# Patient Record
Sex: Male | Born: 1990 | Race: White | Hispanic: No | Marital: Single | Smoking: Current every day smoker
Health system: Southern US, Community
[De-identification: ages and names within clinical notes are randomized; demographics above are authoritative.]

## PROBLEM LIST (undated history)

## (undated) DIAGNOSIS — F329 Major depressive disorder, single episode, unspecified: Secondary | ICD-10-CM

## (undated) DIAGNOSIS — F1110 Opioid abuse, uncomplicated: Secondary | ICD-10-CM

## (undated) DIAGNOSIS — G4700 Insomnia, unspecified: Secondary | ICD-10-CM

## (undated) DIAGNOSIS — F32A Depression, unspecified: Secondary | ICD-10-CM

## (undated) DIAGNOSIS — F419 Anxiety disorder, unspecified: Secondary | ICD-10-CM

## (undated) DIAGNOSIS — F1410 Cocaine abuse, uncomplicated: Secondary | ICD-10-CM

## (undated) HISTORY — PX: OTHER SURGICAL HISTORY: SHX169

---
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
## 2012-06-15 ENCOUNTER — Encounter (HOSPITAL_COMMUNITY): Payer: Self-pay | Admitting: *Deleted

## 2012-06-15 ENCOUNTER — Emergency Department (HOSPITAL_COMMUNITY): Payer: No Typology Code available for payment source

## 2012-06-15 ENCOUNTER — Emergency Department (HOSPITAL_COMMUNITY)
Admission: EM | Admit: 2012-06-15 | Discharge: 2012-06-16 | Disposition: A | Discharge status: Home / Self Care | Payer: No Typology Code available for payment source | Attending: Emergency Medicine | Admitting: Emergency Medicine

## 2012-06-15 DIAGNOSIS — S0990XA Unspecified injury of head, initial encounter: Secondary | ICD-10-CM

## 2012-06-15 DIAGNOSIS — Y92410 Unspecified street and highway as the place of occurrence of the external cause: Secondary | ICD-10-CM | POA: Insufficient documentation

## 2012-06-15 DIAGNOSIS — Z79899 Other long term (current) drug therapy: Secondary | ICD-10-CM | POA: Insufficient documentation

## 2012-06-15 DIAGNOSIS — R1032 Left lower quadrant pain: Secondary | ICD-10-CM | POA: Insufficient documentation

## 2012-06-15 DIAGNOSIS — Y998 Other external cause status: Secondary | ICD-10-CM | POA: Insufficient documentation

## 2012-06-15 DIAGNOSIS — R51 Headache: Secondary | ICD-10-CM | POA: Insufficient documentation

## 2012-06-15 DIAGNOSIS — S139XXA Sprain of joints and ligaments of unspecified parts of neck, initial encounter: Secondary | ICD-10-CM | POA: Insufficient documentation

## 2012-06-15 DIAGNOSIS — F17200 Nicotine dependence, unspecified, uncomplicated: Secondary | ICD-10-CM | POA: Insufficient documentation

## 2012-06-15 DIAGNOSIS — S161XXA Strain of muscle, fascia and tendon at neck level, initial encounter: Secondary | ICD-10-CM

## 2012-06-15 MED ORDER — IOHEXOL 300 MG/ML  SOLN
80.0000 mL | Freq: Once | INTRAMUSCULAR | Status: AC | PRN
  Administered 2012-06-15: 80 mL via INTRAVENOUS

## 2012-06-15 MED ORDER — IBUPROFEN 800 MG PO TABS: 800.0000 mg | ORAL_TABLET | Freq: Three times a day (TID) | ORAL | Status: DC

## 2012-06-15 MED ORDER — IBUPROFEN 800 MG PO TABS
800.0000 mg | ORAL_TABLET | Freq: Once | ORAL | Status: AC
  Administered 2012-06-15: 800 mg via ORAL
  Filled 2012-06-15: qty 1
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
## 2012-06-15 NOTE — ED Notes (Signed)
Patient transported to CT 
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
## 2012-06-15 NOTE — ED Notes (Addendum)
NURSE FIRST ROUNDS :  NURSE EXPLAINED DELAY , WAIT TIME AND PROCESS , RESPIRATIONS UNLABORED , DENIES PAIN AT THIS TIME , C- COLLAR INTACT.

## 2012-06-15 NOTE — ED Notes (Signed)
Phili collar applied

## 2012-06-16 MED ORDER — KETOROLAC TROMETHAMINE 30 MG/ML IJ SOLN
30.0000 mg | Freq: Once | INTRAMUSCULAR | Status: AC
  Administered 2012-06-16: 30 mg via INTRAVENOUS
  Filled 2012-06-16: qty 1

## 2012-06-28 ENCOUNTER — Other Ambulatory Visit: Payer: Self-pay | Admitting: Internal Medicine

## 2012-06-28 DIAGNOSIS — R42 Dizziness and giddiness: Secondary | ICD-10-CM

## 2012-07-06 ENCOUNTER — Ambulatory Visit
Admission: RE | Admit: 2012-07-06 | Discharge: 2012-07-06 | Disposition: A | Discharge status: Home / Self Care | Payer: BC Managed Care – PPO | Source: Ambulatory Visit | Attending: Internal Medicine | Admitting: Internal Medicine

## 2012-07-06 DIAGNOSIS — R42 Dizziness and giddiness: Secondary | ICD-10-CM

## 2013-10-12 ENCOUNTER — Encounter (HOSPITAL_COMMUNITY): Payer: Self-pay | Admitting: Emergency Medicine

## 2013-10-12 ENCOUNTER — Emergency Department (HOSPITAL_COMMUNITY)
Admission: EM | Admit: 2013-10-12 | Discharge: 2013-10-13 | Disposition: A | Discharge status: Home / Self Care | Payer: BC Managed Care – PPO | Attending: Emergency Medicine | Admitting: Emergency Medicine

## 2013-10-12 DIAGNOSIS — F1410 Cocaine abuse, uncomplicated: Secondary | ICD-10-CM | POA: Diagnosis not present

## 2013-10-12 DIAGNOSIS — Z79899 Other long term (current) drug therapy: Secondary | ICD-10-CM | POA: Insufficient documentation

## 2013-10-12 DIAGNOSIS — F17200 Nicotine dependence, unspecified, uncomplicated: Secondary | ICD-10-CM | POA: Insufficient documentation

## 2013-10-12 DIAGNOSIS — F1110 Opioid abuse, uncomplicated: Secondary | ICD-10-CM | POA: Diagnosis present

## 2013-10-12 LAB — CBC WITH DIFFERENTIAL/PLATELET
BASOS ABS: 0.1 10*3/uL (ref 0.0–0.1)
Basophils Relative: 1 % (ref 0–1)
Eosinophils Absolute: 0.2 10*3/uL (ref 0.0–0.7)
Eosinophils Relative: 3 % (ref 0–5)
HCT: 36.7 % — ABNORMAL LOW (ref 39.0–52.0)
HEMOGLOBIN: 12.7 g/dL — AB (ref 13.0–17.0)
Lymphocytes Relative: 30 % (ref 12–46)
Lymphs Abs: 2.8 10*3/uL (ref 0.7–4.0)
MCH: 30 pg (ref 26.0–34.0)
MCHC: 34.6 g/dL (ref 30.0–36.0)
MCV: 86.8 fL (ref 78.0–100.0)
MONOS PCT: 16 % — AB (ref 3–12)
Monocytes Absolute: 1.5 10*3/uL — ABNORMAL HIGH (ref 0.1–1.0)
NEUTROS ABS: 4.6 10*3/uL (ref 1.7–7.7)
Neutrophils Relative %: 50 % (ref 43–77)
Platelets: 331 10*3/uL (ref 150–400)
RBC: 4.23 MIL/uL (ref 4.22–5.81)
RDW: 12.4 % (ref 11.5–15.5)
WBC: 9.1 10*3/uL (ref 4.0–10.5)

## 2013-10-12 MED ORDER — ACETAMINOPHEN 325 MG PO TABS: 650.0000 mg | ORAL_TABLET | ORAL | Status: DC | PRN

## 2013-10-12 MED ORDER — LORAZEPAM 1 MG PO TABS: 1.0000 mg | ORAL_TABLET | Freq: Three times a day (TID) | ORAL | Status: DC | PRN

## 2013-10-12 MED ORDER — ONDANSETRON HCL 4 MG PO TABS: 4.0000 mg | ORAL_TABLET | Freq: Three times a day (TID) | ORAL | Status: DC | PRN

## 2013-10-12 MED ORDER — NICOTINE 21 MG/24HR TD PT24: 21.0000 mg | MEDICATED_PATCH | Freq: Every day | TRANSDERMAL | Status: DC

## 2013-10-12 MED ORDER — ZOLPIDEM TARTRATE 5 MG PO TABS: 5.0000 mg | ORAL_TABLET | Freq: Every evening | ORAL | Status: DC | PRN

## 2013-10-12 MED ORDER — IBUPROFEN 200 MG PO TABS
600.0000 mg | ORAL_TABLET | Freq: Three times a day (TID) | ORAL | Status: DC | PRN
Start: 2013-10-12 — End: 2013-10-13

## 2013-10-12 MED ORDER — ALUM & MAG HYDROXIDE-SIMETH 200-200-20 MG/5ML PO SUSP: 30.0000 mL | ORAL | Status: DC | PRN

## 2013-10-12 NOTE — ED Notes (Signed)
Pts name called to go to a room no answer 

## 2013-10-12 NOTE — Progress Notes (Signed)
  CARE MANAGEMENT ED NOTE 10/12/2013  Patient:  Gregory Andrade,Gregory Andrade   Account Number:  [id number]  Date Initiated:  10/12/2013  Documentation initiated by:  Radford Pax  Subjective/Objective Assessment:   Patient presents to Ed with trying to detox from heroin, methadone, cocaine, and methamphetamines.  Withdrawal symptoms include generalized pain, nausea, insomnia, weakness, tremors     Subjective/Objective Assessment Detail:     Action/Plan:   Consult TTS   Action/Plan Detail:   Anticipated DC Date:       Status Recommendation to Physician:   Result of Recommendation:    Other ED Services  Consult Working Plan   In-house referral  Clinical Social Worker   DC Associate Professor  CM consult  Other    Choice offered to / List presented to:            Status of service:  Completed, signed off  ED Comments:   ED Comments Detail:  EDCM spoke to EDPA regarding disposition.  EDCM explained to EDPA the need for patient to be medically stable prior to discharge.  [facility] consulted EDSW who spoke to [facility] and faxed over information substance abuse  resources, prescreen tool for [facility], and contact information for [facility], [facility], [facility], and [facility] for detox.  Blood work ordered for patient.  No further EDCM needs at this time.

## 2013-10-12 NOTE — ED Provider Notes (Signed)
CSN: [id number]     Arrival date & time 10/12/13  1522 History   First MD Initiated Contact with Patient 10/12/13 2110     Chief Complaint  Patient presents with  . detox      (Consider location/radiation/quality/duration/timing/severity/associated sxs/prior Treatment) HPI  Patient presents with request for detox.  He was told by [facility] he needed to come here first before being placed.  States he injects drugs and injects everything he can find, mostly narcotics.  Uses cocaine, ketamine, methadone, methamphetamines, ecstasy, all psychedelics, crack.  Denies ETOH.  Uses very occasional benzodiazepines.  Has been weaning himself off of narcotics for two weeks.  Has felt clammy, hot sweats, insomnia, myalgias, N/V.  Today he was feeling so bad he used heroin to improve his symptoms.  Currently feeling well.    History reviewed. No pertinent past medical history. Past Surgical History  Procedure Laterality Date  . Wisdom teeth     No family history on file. History  Substance Use Topics  . Smoking status: Current Every Day Smoker -- 1.00 packs/day    Types: Cigarettes  . Smokeless tobacco: Not on file  . Alcohol Use: No    Review of Systems  All other systems reviewed and are negative.     Allergies  Review of patient's allergies indicates no known allergies.  Home Medications   Prior to Admission medications   Medication Sig Start Date End Date Taking? Authorizing Provider  baclofen (LIORESAL) 10 MG tablet Take 10 mg by mouth as needed for muscle spasms.    Yes Historical Provider, MD  cloNIDine (CATAPRES) 0.1 MG tablet Take 0.1 mg by mouth 4 (four) times daily.   Yes Historical Provider, MD  methadone (DOLOPHINE) 10 MG tablet Take 60-70 mg by mouth daily.   Yes Historical Provider, MD   BP 113/59  Pulse 69  Temp(Src) 97.8 F (36.6 C) (Oral)  Resp 17  Ht  (1.778 m)  Wt 142 lb (64.411 kg)  BMI 20.37 kg/m2  SpO2 99% Physical Exam  Nursing note and vitals  reviewed. Constitutional: He appears well-developed and well-nourished. No distress.  HENT:  Head: Normocephalic and atraumatic.  Neck: Neck supple.  Cardiovascular: Normal rate and regular rhythm.   Pulmonary/Chest: Effort normal and breath sounds normal. No respiratory distress. He has no wheezes. He has no rales.  Abdominal: Soft. He exhibits no distension and no mass. There is no tenderness. There is no rebound and no guarding.  Neurological: He is alert. He exhibits normal muscle tone.  Skin: He is not diaphoretic.  Track marks, bilateral arms.    No e/o infection    ED Course  Procedures (including critical care time) Labs Review Labs Reviewed  CBC WITH DIFFERENTIAL - Abnormal; Notable for the following:    Hemoglobin 12.7 (*)    HCT 36.7 (*)    Monocytes Relative 16 (*)    Monocytes Absolute 1.5 (*)    All other components within normal limits  COMPREHENSIVE METABOLIC PANEL - Abnormal; Notable for the following:    Glucose, Bld 106 (*)    Total Bilirubin 0.2 (*)    All other components within normal limits  URINE RAPID DRUG SCREEN (HOSP PERFORMED) - Abnormal; Notable for the following:    Opiates POSITIVE (*)    Cocaine POSITIVE (*)    All other components within normal limits  ETHANOL    Imaging Review No results found.   EKG Interpretation None      10:48 PM I  have spoken with case Production designer, theatre/television/film and Child psychotherapist.  They suggest contacting [facility] and [facility], who can do multidrug detox.    11:52 PM I spoke with TTS who will attempt placement.    12:38 AM TTS has called many facilities.  Pt has option to go to [facility] to [facility].    MDM   Final diagnoses:  Narcotic abuse  Cocaine abuse    Patient with chronic polysubstance abuse presenting requesting detox. He would like to do outpatient treatment after inpatient detox. He uses injected medications, mostly narcotics. TTS found patient placement at [facility] in [place].  No SI.  Pt d/c to go to  [facility].  Discussed result, findings, treatment, and follow up  with patient.  Pt given return precautions.  Pt verbalizes understanding and agrees with plan.        [place], PA-C 10/13/13 [phone]

## 2013-10-12 NOTE — BH Assessment (Addendum)
Spoke to [employer], PA-C prior to assessment. Pt is requesting opiate detox but does not endorse SI/HI or other mental health concerns. He does not meet criteria for TTS assessment and should be referred out for detox.   Trixie Dredge PA-C will run labs to facilitate placement and this writer will seek placement.   [facility] per Melissa has no beds.  [facility] per Andrey Campanile they have beds available but do not take BC/BS.   [facility] per Coreen, pt would have to present in person for possible acceptance. They may take his insurance. [phone] address [address] [place], [place]   [facility] does not take admissions at night per recorded message.   [facility] residential treatment [phone] no answer.  Informed Trixie Dredge PA-C of [facility] being only option tonight. Faxed list of SA referrals.  Clista Bernhardt, [facility] Triage Specialist 10/13/2013 12:13 AM

## 2013-10-12 NOTE — ED Notes (Signed)
Pt stating he went to get food.  Pt back requesting to be treated.

## 2013-10-12 NOTE — ED Notes (Addendum)
Pt states that he is here for detox from heroin, cocaine, methadone, and methamphetamines. Pt states "i have been detoxing for 2 weeks but the methadone has been pretty hard to do". Pt states last use was heroin this morning. Pt states that his arm hurt from injecting. Pt states that he is having difficulty sleeping, body aches, dizziness.

## 2013-10-12 NOTE — ED Notes (Signed)
Pt trying to detox from heroin, methadone, cocaine, and methamphetamines.  Withdrawal symptoms include generalized pain, nausea, insomnia, weakness, tremors.  NAD, respirations equal and unlabored, skin warm and dry.

## 2013-10-13 LAB — RAPID URINE DRUG SCREEN, HOSP PERFORMED
Amphetamines: NOT DETECTED
Barbiturates: NOT DETECTED
Benzodiazepines: NOT DETECTED
COCAINE: POSITIVE — AB
OPIATES: POSITIVE — AB
Tetrahydrocannabinol: NOT DETECTED

## 2013-10-13 LAB — COMPREHENSIVE METABOLIC PANEL
ALT: 12 U/L (ref 0–53)
AST: 14 U/L (ref 0–37)
Albumin: 3.8 g/dL (ref 3.5–5.2)
Alkaline Phosphatase: 89 U/L (ref 39–117)
Anion gap: 12 (ref 5–15)
BUN: 18 mg/dL (ref 6–23)
CALCIUM: 9.2 mg/dL (ref 8.4–10.5)
CO2: 29 meq/L (ref 19–32)
CREATININE: 0.94 mg/dL (ref 0.50–1.35)
Chloride: 97 mEq/L (ref 96–112)
GFR calc Af Amer: >90 mL/min (ref >90)
GFR calc non Af Amer: >90 mL/min (ref >90)
Glucose, Bld: 106 mg/dL — ABNORMAL HIGH (ref 70–99)
Potassium: 3.9 mEq/L (ref 3.7–5.3)
SODIUM: 138 meq/L (ref 137–147)
TOTAL PROTEIN: 8.1 g/dL (ref 6.0–8.3)
Total Bilirubin: 0.2 mg/dL — ABNORMAL LOW (ref 0.3–1.2)

## 2013-10-13 LAB — ETHANOL

## 2013-10-13 NOTE — ED Provider Notes (Signed)
Medical screening examination/treatment/procedure(s) were performed by non-physician practitioner and as supervising physician I was immediately available for consultation/collaboration.   EKG Interpretation None        Cecillia Menees H Luka Reisch, MD 10/13/13 1043 

## 2013-10-13 NOTE — Discharge Instructions (Signed)
Go directly to [facility].  [address] [place], [place]. [phone]   Opioid Use Disorder Opioid use disorder is a mental disorder. It is the continued nonmedical use of opioids in spite of risks to health and well-being. Misused opioids include the street drug heroin. They also include pain medicines such as morphine, hydrocodone, oxycodone, and fentanyl. Opioids are very addictive. People who misuse opioids get an exaggerated feeling of well-being. Opioid use disorder often disrupts activities at home, work, or school. It may cause mental or physical problems.  A family history of opioid use disorder puts you at higher risk of it. People with opioid use disorder often misuse other drugs or have mental illness such as depression, posttraumatic stress disorder, or antisocial personality disorder. They also are at risk of suicide and death from overdose. SIGNS AND SYMPTOMS  Signs and symptoms of opioid use disorder include:  Use of opioids in larger amounts or over a longer period than intended.  Unsuccessful attempts to cut down or control opioid use.  A lot of time spent obtaining, using, or recovering from the effects of opioids.  A strong desire or urge to use opioids (craving).  Continued use of opioids in spite of major problems at work, school, or home because of use.  Continued use of opioids in spite of relationship problems because of use.  Giving up or cutting down on important life activities because of opioid use.  Use of opioids over and over in situations when it is physically hazardous, such as driving a car.  Continued use of opioids in spite of a physical problem that is likely related to use. Physical problems can include:  Severe constipation.  Poor nutrition.  Infertility.  Tuberculosis.  Aspiration pneumonia.  Infections such as human immunodeficiency virus (HIV) and hepatitis (from injecting opioids).  Continued use of opioids in spite of  a mental problem that is likely related to use. Mental problems can include:  Depression.  Anxiety.  Hallucinations.  Sleep problems.  Loss of sexual function.  Need to use more and more opioids to get the same effect, or lessened effect over time with use of the same amount (tolerance).  Having withdrawal symptoms when opioid use is stopped, or using opioids to reduce or avoid withdrawal symptoms. Withdrawal symptoms include:  Depressed, anxious, or irritable mood.  Nausea, vomiting, diarrhea, or intestinal cramping.  Muscle aches or spasms.  Excessive tearing or runny nose.  Dilated pupils, sweating, or hairs standing on end.  Yawning.  Fever, raised blood pressure, or fast pulse.  Restlessness or trouble sleeping. This does not apply to people taking opioids for medical reasons only. DIAGNOSIS Opioid use disorder is diagnosed by your health care provider. You may be asked questions about your opioid use and and how it affects your life. A physical exam may be done. A drug screen may be ordered. You may be referred to a mental health professional. The diagnosis of opioid use disorder requires at least two symptoms within 12 months. The type of opioid use disorder you have depends on the number of signs and symptoms you have. The type may be:  Mild. Two or three signs and symptoms.   Moderate. Four or five signs and symptoms.   Severe. Six or more signs and symptoms. TREATMENT  Treatment is usually provided by mental health professionals with training in substance use disorders.The following options are available:  Detoxification.This is the first step in treatment for withdrawal. It is medically supervised  withdrawal with the use of medicines. These medicines lessen withdrawal symptoms. They also raise the chance of becoming opioid free.  Counseling, also known as talk therapy. Talk therapy addresses the reasons you use opioids. It also addresses ways to keep you  from using again (relapse). The goals of talk therapy are to avoid relapse by:  Identifying and avoiding triggers for use.  Finding healthy ways to cope with stress.  Learning how to handle cravings.  Support groups. Support groups provide emotional support, advice, and guidance.  A medicine that blocks opioid receptors in your brain. This medicine can reduce opioid cravings that lead to relapse. This medicine also blocks the desired opioid effect when relapse occurs.  Opioids that are taken by mouth in place of the misused opioid (opioid maintenance treatment). These medicines satisfy cravings but are safer than commonly misused opioids. This often is the best option for people who continue to relapse with other treatments. HOME CARE INSTRUCTIONS   Take medicines only as directed by your health care provider.  Check with your health care provider before starting new medicines.  Keep all follow-up visits as directed by your health care provider. SEEK MEDICAL CARE IF:  You are not able to take your medicines as directed.  Your symptoms get worse. SEEK IMMEDIATE MEDICAL CARE IF:  You have serious thoughts about hurting yourself or others.  You may have taken an overdose of opioids. FOR MORE INFORMATION  National Institute on Drug Abuse: http://www.price-smith.com/  Substance Abuse and Mental Health Services Administration: SkateOasis.com.pt Document Released: 11/03/2006 Document Revised: 05/23/2013 Document Reviewed: 01/19/2013 [facility] Patient Information 2015 [place], [place]. This information is not intended to replace advice given to you by your health care provider. Make sure you discuss any questions you have with your health care provider.

## 2013-10-18 ENCOUNTER — Encounter (HOSPITAL_COMMUNITY): Payer: Self-pay | Admitting: Psychology

## 2013-10-19 ENCOUNTER — Other Ambulatory Visit (HOSPITAL_COMMUNITY): Payer: PRIVATE HEALTH INSURANCE | Attending: Psychiatry | Admitting: Psychology

## 2013-10-19 DIAGNOSIS — F1920 Other psychoactive substance dependence, uncomplicated: Secondary | ICD-10-CM | POA: Insufficient documentation

## 2013-10-19 DIAGNOSIS — F4325 Adjustment disorder with mixed disturbance of emotions and conduct: Secondary | ICD-10-CM | POA: Diagnosis not present

## 2013-10-19 DIAGNOSIS — F1120 Opioid dependence, uncomplicated: Secondary | ICD-10-CM

## 2013-10-19 DIAGNOSIS — F410 Panic disorder [episodic paroxysmal anxiety] without agoraphobia: Secondary | ICD-10-CM | POA: Insufficient documentation

## 2013-10-21 ENCOUNTER — Other Ambulatory Visit (HOSPITAL_COMMUNITY): Payer: PRIVATE HEALTH INSURANCE | Attending: Psychiatry | Admitting: Psychology

## 2013-10-21 DIAGNOSIS — G4700 Insomnia, unspecified: Secondary | ICD-10-CM | POA: Insufficient documentation

## 2013-10-21 DIAGNOSIS — F1123 Opioid dependence with withdrawal: Secondary | ICD-10-CM | POA: Diagnosis not present

## 2013-10-21 DIAGNOSIS — F19982 Other psychoactive substance use, unspecified with psychoactive substance-induced sleep disorder: Secondary | ICD-10-CM | POA: Insufficient documentation

## 2013-10-21 DIAGNOSIS — F410 Panic disorder [episodic paroxysmal anxiety] without agoraphobia: Secondary | ICD-10-CM | POA: Diagnosis not present

## 2013-10-21 DIAGNOSIS — F1910 Other psychoactive substance abuse, uncomplicated: Secondary | ICD-10-CM | POA: Insufficient documentation

## 2013-10-21 DIAGNOSIS — Z9114 Patient's other noncompliance with medication regimen: Secondary | ICD-10-CM | POA: Insufficient documentation

## 2013-10-21 DIAGNOSIS — F1120 Opioid dependence, uncomplicated: Secondary | ICD-10-CM | POA: Insufficient documentation

## 2013-10-21 DIAGNOSIS — F411 Generalized anxiety disorder: Secondary | ICD-10-CM | POA: Diagnosis not present

## 2013-10-21 DIAGNOSIS — F4325 Adjustment disorder with mixed disturbance of emotions and conduct: Secondary | ICD-10-CM | POA: Insufficient documentation

## 2013-10-21 DIAGNOSIS — G4710 Hypersomnia, unspecified: Secondary | ICD-10-CM | POA: Diagnosis not present

## 2013-10-21 DIAGNOSIS — F1994 Other psychoactive substance use, unspecified with psychoactive substance-induced mood disorder: Secondary | ICD-10-CM | POA: Insufficient documentation

## 2013-10-21 DIAGNOSIS — F4323 Adjustment disorder with mixed anxiety and depressed mood: Secondary | ICD-10-CM | POA: Insufficient documentation

## 2013-10-21 MED ORDER — KETOROLAC TROMETHAMINE 10 MG PO TABS: 10.0000 mg | ORAL_TABLET | Freq: Four times a day (QID) | ORAL | Status: DC | PRN

## 2013-10-21 MED ORDER — MIRTAZAPINE 15 MG PO TBDP: 15.0000 mg | ORAL_TABLET | Freq: Every day | ORAL | Status: DC

## 2013-10-21 MED ORDER — METHOCARBAMOL 750 MG PO TABS: 750.0000 mg | ORAL_TABLET | Freq: Three times a day (TID) | ORAL | Status: DC | PRN

## 2013-10-21 MED ORDER — HYDROXYZINE PAMOATE 25 MG PO CAPS: 25.0000 mg | ORAL_CAPSULE | ORAL | Status: DC | PRN

## 2013-10-21 NOTE — Progress Notes (Signed)
Psychiatric Assessment Adult  Patient Identification:  Gregory Andrade Date of Evaluation:  10/21/2013 Chief Complaint: "aches and pains-" "I've tried to fix it (using drugs) and I cant do it and I know my times running out" History of Chief Complaint:  Pt began using alcohol xanax and pot at age 23. Believes he was addicted almost immediatley . At age 80 he started using cocaine and opiates he "hated " cocaine  But 3 yrs later he began using opiates addictively after he "got sick on hallucinogens and speed (meth/ectasy/sasafras/research chemicals)" ("my first love") after taking them everyday and going to jail for dealing in hallucinogens and facing 30-50 yrs in prison.He was transferred to treatment from [facility] and [facility]) with 140 days of clean time until he used at [facility].He managed to avoid prison.Over the past 4 years he has used opiate IV daily mainly Heroin $150-300 daily. In August 2015 he ran out of money and sought help from [facility] in [place]. (He had been tried on Suboxone by Dr Dub Mikes at his private practice in 2012 but had bad experience -anhedonia/wgt loss).On Sept 12 2015  he overdosed on IV Ketamine combined with meth coke mushrooms and tried to quit for 5 days but couldn't. He quit going to Methadone clinic after this as well. 2 weeks later He came to the realization that having to stick a needle in his arm to function was no way to live. He sought help from a number of sorces but couldn't get any so he used again as he was in withdrawal.His psychologist Glendell Docker contacted the practice of [facility] and he saw another provider who prescribed clonidine  He also contacted [facility] about pt situation and he was accepted on the basis of his withdrawing off all controlled substances.He started IOP on Weds Oct 1.He used the Sunday before.1 tablet of codeine to help w/d and tried a gray powder a friend called him  about to try which turned out to be morphine.He had labs at [facility] 2 mos ago because he shared needles.They were negative.  HPI -See CC and Hx of CC also Counselor intake for CD IOP Review of Systems  Constitutional: Positive for activity change, appetite change, fatigue and unexpected weight change. Negative for fever, chills and diaphoresis.       Debilitated from IV drug use and opiate withdrawal Currently in Opiate withdrawal  HENT: Negative for congestion, dental problem, drooling, ear discharge, ear pain, facial swelling, hearing loss, postnasal drip, rhinorrhea, sinus pressure, sneezing, sore throat, tinnitus, trouble swallowing and voice change.   Eyes: Negative for photophobia, pain, discharge, redness and itching.  Respiratory: Negative for apnea, cough, choking, chest tightness, shortness of breath, wheezing and stridor.   Cardiovascular: Negative for chest pain, palpitations and leg swelling.  Gastrointestinal: Positive for diarrhea and constipation. Negative for nausea, abdominal pain, blood in stool, abdominal distention, anal bleeding and rectal pain.  Endocrine: Negative for cold intolerance, heat intolerance, polydipsia, polyphagia and polyuria.  Genitourinary: Negative for dysuria, urgency, frequency, hematuria, flank pain, decreased urine volume, discharge, penile swelling, scrotal swelling, enuresis, difficulty urinating, penile pain and testicular pain.  Musculoskeletal: Positive for arthralgias, back pain, myalgias and neck pain. Negative for gait problem, joint swelling and neck stiffness.  Skin: Positive for wound (IV tracks). Negative for color change, pallor and rash.  Allergic/Immunologic: Negative for environmental allergies, food allergies and immunocompromised state.  Neurological: Negative for dizziness, tremors, seizures, syncope, facial  asymmetry, speech difficulty, weakness, light-headedness, numbness and headaches.  Hematological: Negative for adenopathy.  Does not bruise/bleed easily.  Psychiatric/Behavioral: Positive for behavioral problems, dysphoric mood, decreased concentration and agitation. Negative for suicidal ideas, hallucinations, confusion, sleep disturbance and self-injury. The patient is nervous/anxious. The patient is not hyperactive.    Physical Exam  Constitutional: He is oriented to person, place, and time.  Cachetic/pale  HENT:  Head: Normocephalic and atraumatic.  Right Ear: External ear normal.  Left Ear: External ear normal.  Nose: Nose normal.  Eyes: Conjunctivae and EOM are normal. Pupils are equal, round, and reactive to light. Right eye exhibits no discharge. Left eye exhibits no discharge.  Neck: Normal range of motion. Neck supple. No JVD present. No tracheal deviation present.  Cardiovascular: Normal rate.   Pulmonary/Chest: Effort normal. No stridor. No respiratory distress. He has no wheezes.  Abdominal:  deferred  Genitourinary:  deferred  Musculoskeletal: Normal range of motion. He exhibits no edema.  Neurological: He is alert and oriented to person, place, and time. No cranial nerve deficit. Coordination normal.  Skin: Skin is warm and dry. There is pallor.  Needle tracks  Psychiatric:  See PSE below    Depressive Symptoms: anhedonia, hypersomnia, fatigue, feelings of worthlessness/guilt, difficulty concentrating, hopelessness, anxiety, panic attacks, loss of energy/fatigue, disturbed sleep, weight loss, decreased labido, decreased appetite,  (Hypo) Manic Symptoms:   Elevated Mood:  NA Irritable Mood:  NA Grandiosity:  NA Distractibility:  NA Labiality of Mood:  NA Delusions:  NA Hallucinations:  NA Impulsivity:  NA Sexually Inappropriate Behavior:  NA Financial Extravagance:  NA Flight of Ideas:  NA  Anxiety Symptoms: Excessive Worry:  Yes Panic Symptoms:  Yes Agoraphobia:  No Obsessive Compulsive: Used to not as bad now  Symptoms: Checking, Specific Phobias:  No Social  Anxiety:  Yes   Psychotic Symptoms:  Hallucinations: Negative None Delusions:  Negative Paranoia:  Negative   Ideas of Reference:  Negative  PTSD Symptoms: Ever had a traumatic exposure:  Negative Had a traumatic exposure in the last month:  Negative Re-experiencing: Negative None Hypervigilance:  Negative Hyperarousal: Negative None Avoidance: Negative None  Traumatic Brain Injury: Negative na  Past Psychiatric History: Diagnosis: Polysubstance dependence including opioids/ multiple psychiatric mood diagnoses  Made while using including GAD/MDD/Bipolar  [facility] per HPI  Outpatient Care: Dr Gertie Gowda PHD/[facility] 1x  Substance Abuse Care: See above  Self-Mutilation: NA  Suicidal Attempts: 1 at age 84 attempted carbon monoxide poisoning-no treatment   Violent Behaviors: NA   Past Medical History:  No past medical history on file. History of Loss of Consciousness:  Yes Seizure History:  No Cardiac History:  Yes Allergies:  No Known Allergies Current Medications:  Current Outpatient Prescriptions  Medication Sig Dispense Refill  . amoxicillin (AMOXIL) 500 MG capsule       . baclofen (LIORESAL) 10 MG tablet Take 10 mg by mouth as needed for muscle spasms.       . cephALEXin (KEFLEX) 500 MG capsule       . chlorhexidine (PERIDEX) 0.12 % solution       . clobetasol (TEMOVATE) 0.05 % external solution       . cloNIDine (CATAPRES) 0.1 MG tablet Take 0.1 mg by mouth 4 (four) times daily.      Marland Kitchen dexamethasone (DECADRON) 4 MG tablet       . gabapentin (NEURONTIN) 100 MG capsule       . ketoconazole (NIZORAL) 2 % shampoo       .  methadone (DOLOPHINE) 10 MG tablet Take 60-70 mg by mouth daily.      Marland Kitchen. oxyCODONE-acetaminophen (PERCOCET) 10-325 MG per tablet       . traMADol (ULTRAM) 50 MG tablet        No current facility-administered medications for this visit.    Previous Psychotropic Medications:  Medication Dose    Suboxone  ?  Methadone  10 mg  Mirtazipine  7.5 mg HS  Multiple antidepressants ?            Substance Abuse History in the last 12 months: Substance Age of 1st Use Last Use Amount Specific Type  Nicotine  Not elicited ? ?   ?  Alcohol 14  08/17/13 1-2 drinks liquor  Cannabis 14  01/13/2013 Daily pot  Opiates 18 10/16/13 ? Morphine/1 tablet codeine Morphine/codeine  Cocaine 18 10/14/13 $200 Shoot/ snort  Methamphetamines 18 08/13/13 1/2 gram daily x 5 days IV Meth  LSD 18 2 yrs daily Oral LSD  Ecstasy [age] daily IV  Benzodiazepines 14 07/01/13 ? Xanax for W/D  Caffeine Not elicited ? ? ?  Inhalants 0 0 0 0  Others:Ketamine; designer drugs  10/01/13-OD ? 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 Legal History: See HPI Hobbies/Interests: Golf  Family History:  Dad's brother speed /crack  Mental Status Examination/Evaluation: Objective:  Appearance: Fairly Groomed and cachectic  Eye Contact::  Good  Speech:  Clear and Coherent  Volume:  Normal  Mood:  Euthymic  Affect:  Congruent and Full Range  Thought Process:  Comprehensible/Addicted  Orientation:  Full (Time, Place, and Person)  Thought Content:  WDL, Delusions, Ilusions and  Rumination  Suicidal Thoughts:  No  Homicidal Thoughts:  No  Judgement:  Impaired  [facility]:  Shallow  Psychomotor Activity:  Normal  Akathisia:  NA  Handed:  Right  AIMS (if indicated):  na  Assets:  Desire for Improvement Financial Resources/Insurance Housing Social Support Transportation    Laboratory/X-Ray Psychological Evaluation(s)   Pending visit to Urgent Care  CD IOP Counselor intake/Matthew Tamari PHD   Assessment:  See below  AXIS I Adjustment Disorder with Mixed Disturbance of Emotions and Conduct;Polysubstance dependence including opiods;SIMD ;SI sleep disorder  AXIS II Deferred  AXIS III No past medical history on file.   AXIS IV other psychosocial or environmental problems and problems with primary support group  AXIS V 41-50 serious symptoms   Treatment Plan/Recommendations:  Plan of Care: Attempt [facility] CD IOP-symptomatic withdrawal meds prescribed  Laboratory:  UDS-routine labs thru Urgent Care pt given list  Psychotherapy: CD IOP group and Individual;continue with Counselor outside  Medications: See Meds  Routine PRN Medications:  Yes opiate w/d robaxin/vistaril ketorolac  Consultations: None at this time  Safety Concerns:  None at this time  Other: Physicakl and labs at Urgent Care     [facility] 10/2/20152:47 PM
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
## 2013-10-23 ENCOUNTER — Encounter (HOSPITAL_COMMUNITY): Payer: Self-pay | Admitting: Psychology

## 2013-10-23 NOTE — Progress Notes (Signed)
    Daily Group Progress Note  Program: CD-IOP   Group Time: 1-2:30 pm  Participation Level: Active  Behavioral Response: Sharing  Type of Therapy: Process Group  Topic: After checking in, group members shared what had gone on in their lives since our last group session.  Group members shared about meetings they had attended.  One group member shared that he had relapsed, and processed that with the group.  There was one new group member who had the chance to introduce himself to the group today.  After checking in, the session shifted to focus on boundaries, how to set them, and discussion about areas where boundaries are needed in patients' lives were considered. Drug tests results were returned today, and drug tests were collected from patients who were absent last session  Group Time: 2:45-4 pm  Participation Level: Minimal  Behavioral Response: Appropriate  Type of Therapy: Psycho-education Group  Topic: The second part of the group was spent discussing the basics of 12-Step programs, in order to reinforce their importance in the recovery process.  Types of meetings were reviewed, and group members were able to talk about different meetings that they enjoyed.  The process of finding a sponsor was reviewed, and their role was discussed, as were the meanings of the 12-Steps.  Summary:  Today was the patient's first group session, and he was able to introduce himself to the group.  He expressed that he had always been to treatment before due to legal troubles, but this time, he is here for himself.  He expressed that he used to play the fiddle and golf, but does not anymore. The patient reported he had eaten 6 pop-tarts this morning, but hadn't eaten for at least 2 days prior. He is still feeling the effects of opioid withdrawal. He was relatively quiet during group discussion, but answered when asked questions.  The group was very welcoming.  The patient has been attending NA meetings  and his sobriety date is 9/26. A drug test was collected from this new group member.  Family Program: Family present? No   Name of family member(s):   UDS collected: Yes Results: positive for codeine and morphine  AA/NA attended?: YesMonday and Tuesday  Sponsor?: No   Darla Mcdonald, LCAS

## 2013-10-24 ENCOUNTER — Other Ambulatory Visit (HOSPITAL_COMMUNITY): Payer: PRIVATE HEALTH INSURANCE | Admitting: Psychology

## 2013-10-24 DIAGNOSIS — F4325 Adjustment disorder with mixed disturbance of emotions and conduct: Secondary | ICD-10-CM | POA: Diagnosis not present

## 2013-10-24 DIAGNOSIS — F1910 Other psychoactive substance abuse, uncomplicated: Secondary | ICD-10-CM

## 2013-10-24 DIAGNOSIS — F19982 Other psychoactive substance use, unspecified with psychoactive substance-induced sleep disorder: Secondary | ICD-10-CM

## 2013-10-24 DIAGNOSIS — F4323 Adjustment disorder with mixed anxiety and depressed mood: Secondary | ICD-10-CM

## 2013-10-24 DIAGNOSIS — F1120 Opioid dependence, uncomplicated: Secondary | ICD-10-CM

## 2013-10-25 ENCOUNTER — Encounter (HOSPITAL_COMMUNITY): Payer: Self-pay | Admitting: Psychology

## 2013-10-25 ENCOUNTER — Ambulatory Visit (HOSPITAL_COMMUNITY): Payer: Self-pay | Admitting: Licensed Clinical Social Worker

## 2013-10-25 DIAGNOSIS — F1120 Opioid dependence, uncomplicated: Secondary | ICD-10-CM

## 2013-10-25 NOTE — Progress Notes (Signed)
    Daily Group Progress Note  Program: CD-IOP   Group Time: 1-2:30 pm  Participation Level: Active  Behavioral Response: Sharing  Type of Therapy: Process Group  Topic: The session opened with a check-in from all group members. Their disclosures typically include how they worked their recovery program since our last group meeting. This includes meetings attended, sponsors and any issues that may have hindered or enhanced their recovery.  The group then discussed the Feeling Chart handout which shows the progression of the emotional aspects of the disorder/disease process. During the session today, the two new group members were seen by the clinical director.  Group Time: 2:45- 4pm  Participation Level: Active  Behavioral Response: Sharing  Type of Therapy: Psycho-education Group  Topic: The second part of the group involved a discussion of the Serenity Prayer, self-acceptance and self -knowledge. A handout was provided with the Serenity Prayer and a request that members identify 5 things they could not change and 5 things they could change. Each patient discussed their answers and explained how they had identified them as such.   Summary: The patient met with the medical director for the first time today.  The patient reported that he quit his job today because he wants to focus entirely on his recovery. 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 He went to a N/A meeting last night. He reports that he is still having stomach issues and hasn't eaten for 2 days. He still can't sleep, reports multitude of aches and pains, can't sleep and feels like he is worse mentally. He admits to taking a codeine pill on Sunday.   Family Program: Family present? No   Name of family member(s):   UDS collected: No Results:   AA/NA attended?: YesWednesday and Thursday  Sponsor?: No, but patient is new to the program   Coleta Grosshans, LCAS

## 2013-10-25 NOTE — Progress Notes (Signed)
    Daily Group Progress Note  Program: CD-IOP   Group Time: 1-2:30 pm  Participation Level: Active  Behavioral Response: Appropriate and Sharing  Type of Therapy: Process Group  Topic: After checking in, group members shared about what went on over the weekend, meetings they went to, and any significant issues they were having.  There was one new group member today who had the chance to introduce herself and share what has led her to this program.  The group then shifted into psychoeducation, where group members completed the "Wheel of Life" exercise.  This exercise assesses their level of satisfaction in eight life domains and shows the level of balance or imbalance represented in one's life.  Group Time: 2:45- 4pm  Participation Level: Active  Behavioral Response: Sharing  Type of Therapy: Psycho-education Group  Topic: The second part of group was spent processing the "Wheel of Life" exercise.  Group members then drew their own responses on the board, discussed their ratings, and processed what they can do to improve satisfaction in certain areas of their lives.  Group members provided feedback to each other and shared ideas about how to improve satisfaction in the different life domains.  Summary: The patient stated that he had laid on the couch all weekend, got his prescribed medications from the pharmacy, and went shopping with his mother to get a suit for a wedding he will be attending.  The patient told the group that he plans to drive himself to the wedding so that he will not be tempted to drink.  He said that he is feeling much better, and others in the group commented that he looks much better than he did as well.  He reported that he had some Chinese herbal tea last night, and he thinks that made him feel better.  During the "Wheel of Life" activity, the patient shared that he is not fulfilled or satisfied in any area of his life currently.  Toward the end of group, he stated  that he had found three clean syringes in his car and that was a huge trigger for him.  He told the group that he had thrown the syringes away, but did not know if he had anything else in his car.   The patient was much more talkative today and his sobriety date remains 9/26.   Family Program: Family present? No   Name of family member(s):   UDS collected: No Results:   AA/NA attended?: YesSaturday and Sunday  Sponsor?: No   Mira Balon, LCAS

## 2013-10-26 ENCOUNTER — Other Ambulatory Visit (HOSPITAL_COMMUNITY): Payer: PRIVATE HEALTH INSURANCE | Admitting: Psychology

## 2013-10-26 DIAGNOSIS — F4325 Adjustment disorder with mixed disturbance of emotions and conduct: Secondary | ICD-10-CM | POA: Diagnosis not present

## 2013-10-26 DIAGNOSIS — F1910 Other psychoactive substance abuse, uncomplicated: Secondary | ICD-10-CM

## 2013-10-26 DIAGNOSIS — F19982 Other psychoactive substance use, unspecified with psychoactive substance-induced sleep disorder: Secondary | ICD-10-CM

## 2013-10-26 DIAGNOSIS — F1120 Opioid dependence, uncomplicated: Secondary | ICD-10-CM

## 2013-10-27 ENCOUNTER — Encounter (HOSPITAL_COMMUNITY): Payer: Self-pay | Admitting: Psychology

## 2013-10-27 NOTE — Progress Notes (Signed)
    Daily Group Progress Note  Program: CD-IOP   Group Time: 1-1:45 pm  Participation Level: Active  Behavioral Response: Appropriate and Sharing  Type of Therapy: Process Group  Topic: The first part of group was spent in process. Members checked-in with sobriety dates and followed up with issues and concerns in early recovery. One member admitted he had relapsed and the relapse was discussed at length. Two new members were present and they shared briefly about themselves. The new members shared openly about the reasons for being in the group and they received good feedback and support from their new fellow group members. Both admitted that they were very hesitant and anxious about this first group experience, but reported that they felt much more relaxed after hearing others share about their experiences.   Group Time: 2-4 pm  Participation Level: Active  Behavioral Response: Sharing  Type of Therapy: Psycho-education Group  Topic: Guest Speaker: the remainder of the session was spent with a Herbalistguest visitor. This man talked about his life and his chemical use that began at a young age. He talked bout having been confronted by family and friends and the growing negative consequences of his drug use. The visitor invited questions and group members asked about certain feelings or experiences and he was very receptive and offered good feedback. The session was inspiring and offered possibilities sand hope that some may not have had previously. Drug tests were collected this afternoon.    Summary: The patient checked in and admitted he had relapsed. He described a series of events that culminated in him finding more syringes in his car. Upon discovering them, the call to his dealer, securing the dope and shooting it took less than 30 minutes. He relapsed before he 'actually' relapsed and it did not occur to him to do anything else. The group discussed different options he might have taken  instead of using. There was good support offered to this young man and he was encouraged to just start again and let it go. The patient made some excellent comments during the guest speaker's visit and described to one of the new members how one can never use again once they have become addicted. Despite his remorse and discouragement, the patient was validated and encouraged throughout the session. He responded well to this intervention. His sobriety date has now changed and is 10/6.   Family Program: Family present? No   Name of family member(s):   UDS collected: Yes Results: not returned, but patient admitted he used Monday evening  AA/NA attended?: YesTuesday  Sponsor?: No   Zade Falkner, LCAS

## 2013-10-28 ENCOUNTER — Other Ambulatory Visit (HOSPITAL_COMMUNITY): Payer: PRIVATE HEALTH INSURANCE | Admitting: Psychology

## 2013-10-28 DIAGNOSIS — F4325 Adjustment disorder with mixed disturbance of emotions and conduct: Secondary | ICD-10-CM | POA: Diagnosis not present

## 2013-10-28 DIAGNOSIS — F1123 Opioid dependence with withdrawal: Secondary | ICD-10-CM

## 2013-10-28 DIAGNOSIS — F1994 Other psychoactive substance use, unspecified with psychoactive substance-induced mood disorder: Secondary | ICD-10-CM

## 2013-10-28 DIAGNOSIS — F1120 Opioid dependence, uncomplicated: Secondary | ICD-10-CM

## 2013-10-28 DIAGNOSIS — F1193 Opioid use, unspecified with withdrawal: Secondary | ICD-10-CM | POA: Insufficient documentation

## 2013-10-28 DIAGNOSIS — F1910 Other psychoactive substance abuse, uncomplicated: Secondary | ICD-10-CM

## 2013-10-28 MED ORDER — CLONIDINE HCL 0.2 MG/24HR TD PTWK
0.2000 mg | MEDICATED_PATCH | TRANSDERMAL | Status: DC
Start: 2013-10-28 — End: 2013-11-03

## 2013-10-28 MED ORDER — CLONIDINE HCL 0.1 MG PO TABS: 0.1000 mg | ORAL_TABLET | Freq: Four times a day (QID) | ORAL | Status: DC

## 2013-10-28 NOTE — Progress Notes (Signed)
Patient ID: Gregory Andrade, male   DOB: 22-Jul-1990, 23 y.o.   MRN: [id number] Pt admits to using 1/2 gram Heroin Monday.He is noncompliant with other meds because "he is afraid of running out" CC thigh pains and insomnia. Explained he can get Meds from CD IOP so stop not taking them as prescribed.He is started on Clondine patc with po for breakthru.He has a BP cuff and is told NOT TO TAKE CLONIDINE IF BP 90/60 or less. Expained that he MUST STOP USING/[facility] is to have any chance at recovery. Advised he may need referral to higher level of care. Also reports after being given list of labs to be drawn he had to have a dx code.[facility] asking for lab work only instaed of for physical exam with appropriate labs (list given) for his H&P as originally instructed. Given DX code today 305.90 IV drug abuse F11.23 is only code close to situation in ICD 10 which for some reason chooses to ignore intravenous drug abuse a significant prognostic and severity factor!?

## 2013-10-29 ENCOUNTER — Encounter (HOSPITAL_COMMUNITY): Payer: Self-pay

## 2013-10-31 ENCOUNTER — Encounter (HOSPITAL_COMMUNITY): Payer: Self-pay | Admitting: Psychology

## 2013-10-31 ENCOUNTER — Other Ambulatory Visit (HOSPITAL_COMMUNITY): Payer: PRIVATE HEALTH INSURANCE | Admitting: Psychology

## 2013-10-31 DIAGNOSIS — F1120 Opioid dependence, uncomplicated: Secondary | ICD-10-CM

## 2013-10-31 DIAGNOSIS — F19982 Other psychoactive substance use, unspecified with psychoactive substance-induced sleep disorder: Secondary | ICD-10-CM

## 2013-10-31 DIAGNOSIS — F4325 Adjustment disorder with mixed disturbance of emotions and conduct: Secondary | ICD-10-CM | POA: Diagnosis not present

## 2013-10-31 NOTE — Progress Notes (Signed)
    Daily Group Progress Note  Program: CD-IOP   Group Time: 1-2 pm  Participation Level: Active  Behavioral Response: Sharing  Type of Therapy: Process Group  Topic: Process: the first part of group was spent in process. Members shared about the past 2 days and events, experiences or challenges in early recovery. One member had returned today after being gone for the last 4 sessions in order to complete a Xanax taper. A new group member was also present today. She had been in the group about 5 weeks ago, but had rel[facility] and [facility]. She introduced herself and explained what she needed to work on while in the program. There was good feedback and disclosures among members. Drug tests were collected from the new group members and those current members who had not been present in Wednesday's session. During the group today, the medical director pulled out new members along with current members to discuss medications or complete their assessment.   Group Time: 2:15-4 pm  Participation Level: Active  Behavioral Response: Sharing  Type of Therapy: Psycho-education Group  Topic: Guest Speaker: Pharmacist. The pharmacist, EP, [facility] the group today. She discussed the effects of drugs and early withdrawal and what those chemicals cause in the brain. She also shared about mediations frequently prescribed to address various types of mental illness. The guest speaker also identified what medications should be encouraged and what should be avoided when the patient is chemically dependent. There were many questions and the pharmacist answered all of them. The session went well with the members discussing how much they had learned from her visit.   Summary: The patient reported he felt like "shit". He described himself as in great pain and miserable. The patient reported he had been able to eat, but for the most part, his stomach is still very upset. I pointed out  he has 4 days of sobriety. The patient was able to meet with the medical director during this session. During the visit with the guest speaker, this patient asked numerous questions and received explanations for his physical discomfort that he found comforting and validating. The patient made some good comments and responded well to this intervention. His sobriety date is 9/6.    Family Program: Family present? No   Name of family member(s):   UDS collected: No Results:  AA/NA attended?: YesThursday  Sponsor?: No   Billye Nydam, LCAS

## 2013-11-01 ENCOUNTER — Encounter (HOSPITAL_COMMUNITY): Payer: Self-pay | Admitting: Psychology

## 2013-11-01 NOTE — Progress Notes (Signed)
    Daily Group Progress Note  Program: CD-IOP   Group Time: 1-2:30 pm  Participation Level: Active  Behavioral Response: Appropriate and Sharing  Type of Therapy: Process Group  Topic: After checking in, group members shared what the weekend was like for them, as well as any problems that were encountered.  Two group members shared that they had relapsed and the group processed what had happened, challenging each other when rationalization was evident.  There was one new group member who was able to introduce herself and share what has led her to the program.  Group Time: 2:45- 4pm  Participation Level: Active  Behavioral Response: Sharing  Type of Therapy: Psycho-education Group  Topic: The second half of group was spent discussing the issues of Core Beliefs and Self-Esteem.  The group completed a free write activity in which they wrote about themselves, and then shared what they had written with a partner.  Discussion exploring what the experience was like for group members followed.  The group watched a PowerPoint, while discussing their self esteem, core beliefs, and early life experiences that contributed to the development of those core beliefs.  Summary: The patient stated that he is very angry at his girlfriend because she lost his credit card.  He reported that he laid on the couch over the weekend and attended a wedding.  He left the wedding early when people started drinking.  He made some good comments during group and questioned another group member regarding her intention to visit her son.  He was unable to identify many positive qualities about himself as well.  He attended a meeting on Friday night and his sobriety date is 10/6.   Family Program: Family present? No   Name of family member(s):   UDS collected: Yes Results:   AA/NA attended?: YesSaturday and Sunday  Sponsor?: No   Gregory Andrade, LCAS

## 2013-11-02 ENCOUNTER — Other Ambulatory Visit (HOSPITAL_COMMUNITY): Payer: PRIVATE HEALTH INSURANCE | Admitting: Psychology

## 2013-11-02 DIAGNOSIS — F1120 Opioid dependence, uncomplicated: Secondary | ICD-10-CM

## 2013-11-02 DIAGNOSIS — F1910 Other psychoactive substance abuse, uncomplicated: Secondary | ICD-10-CM

## 2013-11-02 DIAGNOSIS — F19982 Other psychoactive substance use, unspecified with psychoactive substance-induced sleep disorder: Secondary | ICD-10-CM

## 2013-11-02 DIAGNOSIS — F4325 Adjustment disorder with mixed disturbance of emotions and conduct: Secondary | ICD-10-CM | POA: Diagnosis not present

## 2013-11-03 ENCOUNTER — Encounter (HOSPITAL_COMMUNITY): Payer: Self-pay | Admitting: Psychology

## 2013-11-03 ENCOUNTER — Telehealth (HOSPITAL_COMMUNITY): Payer: Self-pay | Admitting: Licensed Clinical Social Worker

## 2013-11-03 NOTE — Progress Notes (Signed)
    Daily Group Progress Note  Program: CD-IOP   Group Time: 1-2:30 pm  Participation Level: Active  Behavioral Response: Sharing  Type of Therapy: Process Group  Topic: Guest: the first part of group was spent with a guest speaker. The guest was a Orthoptistchaplain in the [place] system. He shared a little about his past and then discussed the topic of core beliefs with the group. He had been informed previously that the current topic had been negative core beliefs. There was good response among group members and they enjoyed the visit by this man who did no fit their vision of a chaplain. The session was engaging and invited even more questions.   Group Time: 2:45- 4pm  Participation Level: Active  Behavioral Response: Sharing, rationalizing, stereotyping  Type of Therapy: Psycho-education Group  Topic: Psycho-Ed: Core Beliefs: How they are formed and perpetuated. The second half of group was spent in a psycho-ed. The session included a slide show and a continuation of the session on Monday when the topic of "Core Beliefs" was first introduced. When asked for someone's core belief, a member offered hers. The belief was diagrammed on the board and the process and perpetuation of these distorted beliefs was described. There was good disclosure and feedback among group members.   Summary: The patient provided good feedback to his fellow group members during the check-in. He encouraged the new group member to not worry about what other people think about him, but do what is right for him. He admitted he has continued to struggle with withdrawal symptoms. He apologized to the group and admitted he has continued to use while here in the program. The patient talks, but some comments are illogical or stereotypical. I stopped him at one point and instructed him not to use stereotypes in this program. The patient reported he and his family are preparing to get [place] detox [place].  It will include sedating him and putting him through a fast detox while he is out. He is hopeful that he can get into this program. He was more open and disclosing than in previous sessions. His sobriety date is reportedly 10/10.    Family Program: Family present? No   Name of family member(s):   UDS collected: No Results:  AA/NA attended?: [place]  Sponsor?: No   Abeni Finchum, LCAS

## 2013-11-03 NOTE — Progress Notes (Unsigned)
Gregory Andrade is a 23 y.o. male patient ***. CD-IOP: Treatment Planning Session. I met with the patient for our first individual session to discuss goals for treatment. The patient was referred to CD-IOP by his therapist Hulen Luster for his continued use of IV drugs. The patient has been to 3 other treatment facilities while awaiting pending felony drug charges. The charges have all been dismissed and the patient is seeking treatment on his own volition. He was agreeable in identifying.2 goals for treatment with the first goal to establish and maintain abstinence. In the past, the patient has struggled with maintaining abstinent from all drugs with the ability to stay clean for only 4 days. The patient has a desire to maintain abstinence but he is in physical withdrawal from opiates as well as methadone and "just feels horrible." When he uses opiates, he uses not to get high but to just feel normal. The second treatment goal is to develop a sufficient support system for on-going abstinence. The patient attends approximately 5 meetings per week and has met some young people that he can identify with in the 12-step support meetings. He is still trying to identify a temporary sponsor. The program director Harold Hedge prescribed a withdrawal protocol for the patient to address the symptoms of withdrawal from opiates. The patient will meet weekly with the program director to address withdrawal symptoms. The treatment plan was reviewed, signed and dated and he responded well to the intervention. The patient has a desire to learn to live a sober life. He will be seen for individual session 1x per week during the duration of CD IOP program.       Lavinia Mcneely, LCAS

## 2013-11-04 ENCOUNTER — Other Ambulatory Visit (HOSPITAL_COMMUNITY): Payer: PRIVATE HEALTH INSURANCE

## 2013-11-07 ENCOUNTER — Other Ambulatory Visit (HOSPITAL_COMMUNITY): Payer: PRIVATE HEALTH INSURANCE

## 2013-11-09 ENCOUNTER — Other Ambulatory Visit (HOSPITAL_COMMUNITY): Payer: PRIVATE HEALTH INSURANCE | Admitting: Psychology

## 2013-11-09 DIAGNOSIS — F1120 Opioid dependence, uncomplicated: Secondary | ICD-10-CM

## 2013-11-09 DIAGNOSIS — F4325 Adjustment disorder with mixed disturbance of emotions and conduct: Secondary | ICD-10-CM | POA: Diagnosis not present

## 2013-11-09 DIAGNOSIS — F19982 Other psychoactive substance use, unspecified with psychoactive substance-induced sleep disorder: Secondary | ICD-10-CM

## 2013-11-09 DIAGNOSIS — F1910 Other psychoactive substance abuse, uncomplicated: Secondary | ICD-10-CM

## 2013-11-10 ENCOUNTER — Encounter (HOSPITAL_COMMUNITY): Payer: Self-pay | Admitting: Psychology

## 2013-11-10 NOTE — Progress Notes (Signed)
    Daily Group Progress Note  Program: CD-IOP   Group Time: 1-2:30 pm  Participation Level: Active  Behavioral Response: Sharing  Type of Therapy: Process Group  Topic: After checking in, group members each shared what has been going on in their lives since our last group meeting, and any recovery based activities they did.  The group members were all willing to share.  Drug tests were returned to group members today as well. One member arrived late for group and reported she had been contacted by DSS and they were investigating a complaint filed by someone in this group. This disclosure led to a lengthy discussion among group members.   Group Time: 2:45- 4pm  Participation Level: Active  Behavioral Response: Sharing  Type of Therapy: Psycho-education Group  Topic: Psychoeducation:  During the second part of group, the counselor led a discussion about how addiction affects the brain.  A slide show accompanied this presentation. The limbic system is the part of the brain where addiction occurs. Pet scans of an addict's brain were shown, including how it appears 10 days and 100 days after last use.  These slides helped members understand the lack of dopamine and accompanying discomfort and lethargy in early recovery. The concept of the brain's "plasticity" (or ability to repair itself) was emphasized.  Group members discussed their ideas and questions related to the material presented and were active and engaged in the session.  One group member read his "Step One" packet to the group and was given good feedback following his reading.  Summary:Today was the patient's first group since returning from [place] detox treatment in [place].   He stated that being home has been "tough" and that he feels like he can't function due to lack of sleep, cold sweats, and shakiness.  He stated that he is at a 3 on the pain scale, after being at an 8 last week.  The patient explained that he has had many  thoughts of using, but that he was able to reach out to another group member to plan to go to a meeting together.  He also mentioned that he might have gotten kicked out of his parents' house last night because they found out about some gold he had taken and then pawned to buy drugs.  The patient's parents are leaving to go to [place] on Friday, so he talked about things he could do to stay busy.  During the slide show, the patient wondered about his brain and whether it could every recover from his years of drug use? I assured him at the age of 23, there is tremendous plasticity and urged him to change his behaviors so his brain can heal. He also told the group that he was thankful to be back and that he overdosed on Thursday before leaving for [place].  There was a glimmer of gratitude expressed by this young man. The patient's sobriety date is 10/10.    Family Program: Family present? No   Name of family member(s):   UDS collected: No Results:   AA/NA attended?: No , but he just returned [place]  Sponsor?: No   Renzo Vincelette, LCAS

## 2013-11-11 ENCOUNTER — Other Ambulatory Visit (HOSPITAL_COMMUNITY): Payer: PRIVATE HEALTH INSURANCE | Admitting: Psychology

## 2013-11-11 DIAGNOSIS — F4325 Adjustment disorder with mixed disturbance of emotions and conduct: Secondary | ICD-10-CM | POA: Diagnosis not present

## 2013-11-11 DIAGNOSIS — F1120 Opioid dependence, uncomplicated: Secondary | ICD-10-CM

## 2013-11-11 DIAGNOSIS — F1193 Opioid use, unspecified with withdrawal: Secondary | ICD-10-CM

## 2013-11-11 DIAGNOSIS — F1123 Opioid dependence with withdrawal: Secondary | ICD-10-CM

## 2013-11-11 DIAGNOSIS — F19982 Other psychoactive substance use, unspecified with psychoactive substance-induced sleep disorder: Secondary | ICD-10-CM

## 2013-11-11 MED ORDER — QUETIAPINE FUMARATE 50 MG PO TABS: ORAL_TABLET | ORAL | Status: DC

## 2013-11-11 NOTE — Progress Notes (Signed)
Patient ID: Leta BaptistMitchell L Andrade, male   DOB: 1990/11/23, 23 y.o.   MRN: [id number] Pt seen post Rapid Detox- C/O insomnia mainly.Admits to have taken all Valium but not the others.Wants to try seroquel-called Detox and got extension of Valium -[place].Will send seroquel .Reinforced his perception that physically he is out of pain but the mental addiction remains -this is the next step in recovery-the restoration to sanity-Vivitrol will keep him from reward biologically but recovery [facility]/ways of not using from here on out

## 2013-11-13 ENCOUNTER — Encounter (HOSPITAL_COMMUNITY): Payer: Self-pay

## 2013-11-14 ENCOUNTER — Other Ambulatory Visit (HOSPITAL_COMMUNITY): Payer: PRIVATE HEALTH INSURANCE

## 2013-11-14 ENCOUNTER — Encounter (HOSPITAL_COMMUNITY): Payer: Self-pay | Admitting: Psychology

## 2013-11-14 NOTE — Progress Notes (Signed)
    Daily Group Progress Note  Program: CD-IOP   Group Time: 1-2:30 pm  Participation Level: Active  Behavioral Response: Sharing  Type of Therapy: Process Group  Topic: The first part of group was spent sharing what has happened for patients since we last met. Group members shared about problems they were having and also told the group about any recovery related activities they had been doing.  One group member had relapsed and was able to process with the group.  The program director met with some group members and drug test results were returned.  Group Time: 2:45- 4pm  Participation Level: Active  Behavioral Response: Sharing  Type of Therapy: Psycho-education Group  Topic: During the second part of group, patients who had not gotten a chance to share yet were able to.  Toward the end of the session, a Self-Care Assessment was passed out and patients began working on them.  They are being asked to finish the assessment over the weekend and bring it back to group on Monday.  Summary: The patient stated that he had not slept since Tuesday and was "panicking" because his parents left for [place] today.  He told the group that he had a good talk with his mom before she left and that she was being supportive.  However, he is very worried because every time his parents go out of town, "something bad happens".  He is anxious about the downtime he will have, and gets upset when he thinks that he can't get high.  He stated that he has been craving alcohol and that he had changed his number so his dealers would no longer be able to contact him.  Physically, the patient is feeling good, but mentally he is really struggling.  He has not been able to keep food down but is trying to eat.  This weekend he is going to a golf tournament with his grandfather and then going to the state fair on Sunday with some friends who are "sober".  He also plans to go back to work on Monday.  The group helped him  think about activities that could keep him busy.  The patient's sobriety date is 10/16.  Family Program: Family present? No   Name of family member(s):   UDS collected: No Results:   AA/NA attended?: [place]  Sponsor?: No   Nekia Maxham, LCAS

## 2013-11-16 ENCOUNTER — Other Ambulatory Visit (HOSPITAL_COMMUNITY): Payer: PRIVATE HEALTH INSURANCE

## 2013-11-18 ENCOUNTER — Other Ambulatory Visit (HOSPITAL_COMMUNITY): Payer: PRIVATE HEALTH INSURANCE

## 2013-11-21 ENCOUNTER — Other Ambulatory Visit (HOSPITAL_COMMUNITY): Payer: PRIVATE HEALTH INSURANCE | Attending: Psychiatry

## 2013-11-21 ENCOUNTER — Telehealth (HOSPITAL_COMMUNITY): Payer: Self-pay | Admitting: Psychology

## 2013-11-23 ENCOUNTER — Encounter (HOSPITAL_COMMUNITY): Payer: Self-pay | Admitting: Licensed Clinical Social Worker

## 2013-11-23 ENCOUNTER — Other Ambulatory Visit (HOSPITAL_COMMUNITY): Payer: PRIVATE HEALTH INSURANCE | Attending: Psychiatry | Admitting: Psychology

## 2013-11-23 DIAGNOSIS — F411 Generalized anxiety disorder: Secondary | ICD-10-CM | POA: Diagnosis not present

## 2013-11-23 DIAGNOSIS — F319 Bipolar disorder, unspecified: Secondary | ICD-10-CM | POA: Diagnosis not present

## 2013-11-23 DIAGNOSIS — F1910 Other psychoactive substance abuse, uncomplicated: Secondary | ICD-10-CM

## 2013-11-23 DIAGNOSIS — F410 Panic disorder [episodic paroxysmal anxiety] without agoraphobia: Secondary | ICD-10-CM | POA: Insufficient documentation

## 2013-11-23 DIAGNOSIS — F1120 Opioid dependence, uncomplicated: Secondary | ICD-10-CM | POA: Insufficient documentation

## 2013-11-23 DIAGNOSIS — F19982 Other psychoactive substance use, unspecified with psychoactive substance-induced sleep disorder: Secondary | ICD-10-CM

## 2013-11-24 ENCOUNTER — Encounter (HOSPITAL_COMMUNITY): Payer: Self-pay | Admitting: Psychology

## 2013-11-24 ENCOUNTER — Ambulatory Visit (HOSPITAL_COMMUNITY): Payer: Self-pay

## 2013-11-24 NOTE — Progress Notes (Signed)
    Daily Group Progress Note  Program: CD-IOP   Group Time: 1-2:30 pm  Participation Level: Active  Behavioral Response: Grandiose, Disruptive, Attention-Seeking and Drowsy  Type of Therapy: Process Group  Topic: After checking in, group members each shared about how the last couple of days have been, meetings they attended, and any obstacles to recovery that have presented themselves.  One new group member was in attendance and took the time to introduce himself to the group and tell about what has led him to the program.  Cravings and triggers were discussed, as well as the four-step process to relapse and ways that one could intervene before they return to using.  Drug tests were collected from the new group member and 2 others who had been absent from the last session.  Group Time: 2:45- 4pm  Participation Level: Minimal  Behavioral Response: Grandiose, Resistant and Drowsy  Type of Therapy: Psycho-education Group  Topic: The second part of group was spent discussing the concept of self-compassion.  Group members completed a self-compassion assessment, and participated in discussion about the meaning of self-compassion and how they can implement the concept in their lives.  The group members were asked to write a compassionate letter to their selves as an assignment before the next group meeting.  At the end of group, a patient read his "Step One" packet to the group.    Summary: This was the patient's first day back since the sudden death of his brother last week.  The patient reported that he had been using one gram of cocaine and one gram of methamphetamine every day since his brother passed away.  He stated that he had attended one or two meetings, and that his drugs had been delivered to his house since his parents would not allow him to leave.  The group offered support around the death of his brother, but the patient did not want to accept the support.  He stated that he wants  to drink, but knows that he cannot due to his medication.  He also discussed the fact that his girlfriend had not made any payments on the car he bought her in six months, and that it had been repossessed (he referred to her as that 'fucking bitch").  The patient made many inappropriate comments during group today, was distracting and disruptive. He displayed little interest in his recovery or sobriety. The patient's sobriety date is 11/2.  Family Program: Family present? No   Name of family member(s):   UDS collected: Yes Results: not returned from lab  AA/NA attended?: No  Sponsor?: No   Renleigh Ouellet, LCAS

## 2013-11-26 ENCOUNTER — Encounter (HOSPITAL_COMMUNITY): Payer: Self-pay

## 2013-11-26 NOTE — Progress Notes (Unsigned)
  [facility] Summary   Gregory Andrade [id number]  Date of Admission: 10/21/2013 Date of Discharge: 11/23/2013  Course of Treatment: Pt unable to stabilize opiate withdrawal symptoms initially and [facility] which resolved the myalgias.Unfortunately his brother was found dead from an overdose 1weeek after his detox and he relapsed on a daily basis necessitating D/C from IOP to a higher level of care-long term of 1-2 years is recommended .  Goals and Activities to Help Maintain Sobriety: 1. Stay away from old friends who continue to drink and use mind-altering chemicals. 2. Continue practicing Fair Fighting rules in interpersonal conflicts. 3. Continue alcohol and drug refusal skills and call on support systems. 4. Enter LONG TERM TREATMENT PROGRAM  Referrals: Higher level of care to facility of choice   Aftercare services: NA 1. Attend AA/NA meetings NA times per week. 2. Obtain a sponsor and a home group in AA/NA. 3. Return to Psychotherapist: PRN  Next appointment: NA  Plan of Action to Address Continuing Problems: Recommend long term residential treatment    Client has participated in the development of this discharge plan and has received a copy of this completed plan  Court JoyKOBER, [name]  11/26/2013   BH-[facility] CHEM 11/26/2013

## 2013-11-29 NOTE — Progress Notes (Unsigned)
Gregory Andrade is a 24 y.o. male patient. CD-IOP Discharge. I met with the patient after group. He reported in the group that he had used daily since last Monday since his yo brother had died unexpectedly. This is the first time he had been back to group since his brother's death. He was extremely inappropriate and disruptive during the group session today. Based on his inability to obtain sobriety and his numerous relapses I explained to him I would bel referring him to a higher level of care. I suggested [facility] here in [place]. The patient was resistant to inpatient recommendation but I suggested he speak with his parents and discuss residential tx options. The patient was discharged unsuccessfully today 11/23/13.         Asta Corbridge S, Licensed Cli

## 2013-12-12 ENCOUNTER — Other Ambulatory Visit: Payer: Self-pay | Admitting: *Deleted

## 2013-12-12 ENCOUNTER — Ambulatory Visit
Admission: RE | Admit: 2013-12-12 | Discharge: 2013-12-12 | Disposition: A | Discharge status: Home / Self Care | Payer: No Typology Code available for payment source | Source: Ambulatory Visit | Attending: *Deleted | Admitting: *Deleted

## 2013-12-12 DIAGNOSIS — R7611 Nonspecific reaction to tuberculin skin test without active tuberculosis: Secondary | ICD-10-CM

## 2013-12-29 ENCOUNTER — Encounter: Payer: Self-pay | Admitting: Psychiatry

## 2013-12-30 ENCOUNTER — Encounter: Payer: Self-pay | Admitting: Psychiatry

## 2014-01-03 ENCOUNTER — Encounter: Payer: Self-pay | Admitting: Psychiatry

## 2014-05-30 NOTE — Progress Notes (Unsigned)
This encounter was created in error - please disregard.

## 2014-10-19 ENCOUNTER — Encounter (HOSPITAL_COMMUNITY): Payer: Self-pay

## 2014-10-19 ENCOUNTER — Encounter (HOSPITAL_COMMUNITY): Payer: Self-pay | Admitting: *Deleted

## 2014-10-19 ENCOUNTER — Observation Stay (HOSPITAL_COMMUNITY)
Admission: AD | Admit: 2014-10-19 | Discharge: 2014-10-20 | Disposition: A | Discharge status: Home / Self Care | Payer: BLUE CROSS/BLUE SHIELD | Attending: Psychiatry | Admitting: Psychiatry

## 2014-10-19 ENCOUNTER — Emergency Department (HOSPITAL_COMMUNITY)
Admission: EM | Admit: 2014-10-19 | Discharge: 2014-10-19 | Disposition: A | Discharge status: Other Acute Inpatient Hospital | Payer: BLUE CROSS/BLUE SHIELD | Attending: Emergency Medicine | Admitting: Emergency Medicine

## 2014-10-19 DIAGNOSIS — Z008 Encounter for other general examination: Secondary | ICD-10-CM | POA: Diagnosis present

## 2014-10-19 DIAGNOSIS — F17210 Nicotine dependence, cigarettes, uncomplicated: Secondary | ICD-10-CM | POA: Diagnosis not present

## 2014-10-19 DIAGNOSIS — Z79899 Other long term (current) drug therapy: Secondary | ICD-10-CM | POA: Diagnosis not present

## 2014-10-19 DIAGNOSIS — Z888 Allergy status to other drugs, medicaments and biological substances status: Secondary | ICD-10-CM | POA: Diagnosis not present

## 2014-10-19 DIAGNOSIS — F1910 Other psychoactive substance abuse, uncomplicated: Principal | ICD-10-CM | POA: Diagnosis present

## 2014-10-19 DIAGNOSIS — Z720 Tobacco use: Secondary | ICD-10-CM | POA: Diagnosis not present

## 2014-10-19 DIAGNOSIS — F329 Major depressive disorder, single episode, unspecified: Secondary | ICD-10-CM | POA: Insufficient documentation

## 2014-10-19 DIAGNOSIS — F1110 Opioid abuse, uncomplicated: Secondary | ICD-10-CM | POA: Insufficient documentation

## 2014-10-19 DIAGNOSIS — F1410 Cocaine abuse, uncomplicated: Secondary | ICD-10-CM | POA: Insufficient documentation

## 2014-10-19 DIAGNOSIS — F419 Anxiety disorder, unspecified: Secondary | ICD-10-CM | POA: Insufficient documentation

## 2014-10-19 DIAGNOSIS — G4700 Insomnia, unspecified: Secondary | ICD-10-CM | POA: Insufficient documentation

## 2014-10-19 HISTORY — DX: Cocaine abuse, uncomplicated: F14.10

## 2014-10-19 HISTORY — DX: Major depressive disorder, single episode, unspecified: F32.9

## 2014-10-19 HISTORY — DX: Anxiety disorder, unspecified: F41.9

## 2014-10-19 HISTORY — DX: Opioid abuse, uncomplicated: F11.10

## 2014-10-19 HISTORY — DX: Insomnia, unspecified: G47.00

## 2014-10-19 HISTORY — DX: Depression, unspecified: F32.A

## 2014-10-19 LAB — CBC
HCT: 48 % (ref 39.0–52.0)
Hemoglobin: 17.1 g/dL — ABNORMAL HIGH (ref 13.0–17.0)
MCH: 31.5 pg (ref 26.0–34.0)
MCHC: 35.6 g/dL (ref 30.0–36.0)
MCV: 88.4 fL (ref 78.0–100.0)
PLATELETS: 364 10*3/uL (ref 150–400)
RBC: 5.43 MIL/uL (ref 4.22–5.81)
RDW: 12.1 % (ref 11.5–15.5)
WBC: 10.5 10*3/uL (ref 4.0–10.5)

## 2014-10-19 LAB — COMPREHENSIVE METABOLIC PANEL
ALK PHOS: 70 U/L (ref 38–126)
ALT: 15 U/L — AB (ref 17–63)
ANION GAP: 11 (ref 5–15)
AST: 17 U/L (ref 15–41)
Albumin: 4.7 g/dL (ref 3.5–5.0)
BILIRUBIN TOTAL: 0.4 mg/dL (ref 0.3–1.2)
BUN: 18 mg/dL (ref 6–20)
CALCIUM: 9.5 mg/dL (ref 8.9–10.3)
CO2: 25 mmol/L (ref 22–32)
Chloride: 105 mmol/L (ref 101–111)
Creatinine, Ser: 1.05 mg/dL (ref 0.61–1.24)
GFR calc non Af Amer: >60 mL/min (ref >60)
GLUCOSE: 86 mg/dL (ref 65–99)
Potassium: 4.3 mmol/L (ref 3.5–5.1)
SODIUM: 141 mmol/L (ref 135–145)
Total Protein: 9.2 g/dL — ABNORMAL HIGH (ref 6.5–8.1)

## 2014-10-19 LAB — RAPID URINE DRUG SCREEN, HOSP PERFORMED
AMPHETAMINES: NOT DETECTED
Barbiturates: NOT DETECTED
Benzodiazepines: NOT DETECTED
Cocaine: NOT DETECTED
Opiates: NOT DETECTED
Tetrahydrocannabinol: NOT DETECTED

## 2014-10-19 LAB — RAPID HIV SCREEN (HIV 1/2 AB+AG)
HIV 1/2 ANTIBODIES: NONREACTIVE
HIV-1 P24 ANTIGEN - HIV24: NONREACTIVE

## 2014-10-19 LAB — ETHANOL: Alcohol, Ethyl (B): <5 mg/dL (ref <5)

## 2014-10-19 MED ORDER — LORAZEPAM 1 MG PO TABS
1.0000 mg | ORAL_TABLET | Freq: Once | ORAL | Status: AC
  Administered 2014-10-19: 1 mg via ORAL
  Filled 2014-10-19: qty 1

## 2014-10-19 MED ORDER — ALUM & MAG HYDROXIDE-SIMETH 200-200-20 MG/5ML PO SUSP: 30.0000 mL | ORAL | Status: DC | PRN

## 2014-10-19 MED ORDER — QUETIAPINE FUMARATE 100 MG PO TABS
100.0000 mg | ORAL_TABLET | Freq: Every evening | ORAL | Status: DC | PRN
  Administered 2014-10-19: 200 mg via ORAL
  Administered 2014-10-20: 100 mg via ORAL
  Filled 2014-10-19: qty 1
  Filled 2014-10-19: qty 2

## 2014-10-19 MED ORDER — MAGNESIUM HYDROXIDE 400 MG/5ML PO SUSP: 30.0000 mL | Freq: Every day | ORAL | Status: DC | PRN

## 2014-10-19 MED ORDER — IBUPROFEN 800 MG PO TABS
800.0000 mg | ORAL_TABLET | Freq: Once | ORAL | Status: AC
  Administered 2014-10-19: 800 mg via ORAL
  Filled 2014-10-19: qty 1

## 2014-10-19 MED ORDER — ACETAMINOPHEN 325 MG PO TABS
650.0000 mg | ORAL_TABLET | Freq: Four times a day (QID) | ORAL | Status: DC | PRN
Start: 2014-10-19 — End: 2014-10-20

## 2014-10-19 NOTE — [facility] Counselor (Signed)
Consult with Christen Bame, NP. Per Christen Bame, NP patient is able to be d/c tomorrow. Patient has plan to go home then follow up with [facility][see epic notes]. Shean K. Harris, NCC, LCAS-A, LPC-A  Counselor 10/19/2014 11:03 PM

## 2014-10-19 NOTE — BH Assessment (Signed)
[facility] Assessment Progress Note   This clinician obtained signature from patient for OBS voluntary admission.  Patient asked about whether he will have medication to address his current pain.  He asked about whether he can leave if he wants to.  Pt was told that he is encouraged to stay at least until he talks to the provider in the morning when they do rounds.

## 2014-10-19 NOTE — ED Notes (Signed)
Report given to Kim at [facility] in [place] unit

## 2014-10-19 NOTE — ED Notes (Signed)
Called to give report to obs unit at [facility]  Nurse in report to return call

## 2014-10-19 NOTE — BH Assessment (Addendum)
Tele Assessment Note   Gregory Andrade is an 24 y.o. male who came to [facility], NP recommended he come) for substance abuse detox. Pt is accompanied by his mom, Wetzel Bjornstad, who left the room during the assessment. Pt is currently living with mom. Pt has arrived home from "going [place] country" for the past year, and she states he has been using "Heroin, PCP, Crystal Meth, Coke and so on", with his last use being Saturday. Pt presents with sweat visible on his shirt from withdrawals and c/o nausea, tremors and pain all over. Pt states he has not slept in 5 days even with his Seroquel and has been trying to detox on his own.  Pt states he wants to begin the program at [facility] in [place], an 8-5 day program where he will have someone with him at all times for accountability, but was told he will need detox first.  Pt states he is not happy to be here, but he does want help. He has no current OP provider at this time.  Pt was dressed casually, and his hair appeared disheveled. During interview, pt was anxious but cooperative, made poor eye contact, laughed inappropriately at times, and his anxious but almost flippant affect did not match his circumstance, so it is difficult to assess how serious he is about getting help. At one point pt was able to make eye contact and state seriously his plans for treatment.  Pt had restless movement and normal thought content. Pt denies SI, HI, AVH. He was oriented x 4. He states he has a history of 1-2 suicide attempt "years ago" and "about 8" unintentional overdoses.  He has had multiple treatment stays at the [facility], and his longest sobriety is 100 days when pt was 20. Upon arrival, mom became tearful in asking for hlep because she states that pt's brother died a year ago, and "there is a lot of grief in the family" Pt denies that brother overdosed, but said he was an Ralene Ok and was found dead in  the bathroom with unknown cause.  Gray Bernhardt, NP recommends observation unit for pt and he is accepted to Obs bed 2 by Tanna Savoy. Pt will be transported to [facility] for medical clearance, and then transferred to Obs once medically cleared.  Diagnosis:  I. Polysubstance Abuse II. Deferred III. None known IV. Loss of brother V. GAF 55  Past Medical History: No past medical history on file.  Past Surgical History  Procedure Laterality Date  . Wisdom teeth      Family History:  Family History  Problem Relation Age of Onset  . Alcohol abuse Paternal Uncle   . Drug abuse Paternal Uncle     Social History:  reports that he has been smoking Cigarettes.  He has been smoking about 1.00 pack per day. He does not have any smokeless tobacco history on file. He reports that he uses illicit drugs (Heroin, Ketamine, Oxycodone, and Hydrocodone). He reports that he does not drink alcohol.  Additional Social History:  Alcohol / Drug Use Pain Medications: heroiin Prescriptions: denies Over the Counter: none History of alcohol / drug use?: Yes Longest period of sobriety (when/how long): 100 days when pt was 24 yo Negative Consequences of Use: Financial, Armed forces operational officer, Personal relationships, Work / School Withdrawal Symptoms: Sweats, Fever / Chills, Cramps, Nausea / Vomiting, Tremors Substance #1 Name of Substance 1: heroin 1-1/2 gram daily, las use Saturday, began use  at age 59 Substance #2 Name of Substance 2: PCP 2 - Age of First Use: 42 2 - Amount (size/oz): "variable" 2 - Frequency: daily 2 - Duration: years 2 - Last Use / Amount: 1 week ago Substance #3 Name of Substance 3: crystal meth 3 - Age of First Use: 18 3 - Amount (size/oz): variable 3 - Frequency: daily 3 - Duration: years 3 - Last Use / Amount: 1 week Substance #4 Name of Substance 4: Cocaine 4 - Age of First Use: 20 4 - Amount (size/oz): variable 4 - Frequency: was using daily 4 - Duration: years 4 - Last Use / Amount: 3  weeks ago  CIWA:   COWS: Clinical Opiate Withdrawal Scale (COWS) Sweating: Beads of sweat on brow or face Restlessness: Frequent shifting or extraneous movements of legs/arms Bone or Joint Aches: Patient is rubbing joints or muscles and is unable to sit still because of discomfort Runny Nose or Tearing: Nose running or tearing GI Upset: nausea or loose stool Tremor: Slight tremor observable Yawning: No yawning Anxiety or Irritability: Patient obviously irritable/anxious Gooseflesh Skin: Skin is smooth  PATIENT STRENGTHS: (choose at least two) Ability for insight Average or above average intelligence Capable of independent living Communication skills General fund of knowledge Supportive family/friends  Allergies: No Known Allergies  Home Medications:  (Not in a hospital admission)  OB/GYN Status:  No LMP for male patient.  General Assessment Data Location of Assessment: [facility] Assessment Services TTS Assessment: In system Is this a Tele or Face-to-Face Assessment?: Face-to-Face Is this an Initial Assessment or a Re-assessment for this encounter?: Initial Assessment Marital status: Single Living Arrangements: Parent Can pt return to current living arrangement?: Yes Admission Status: Voluntary Is patient capable of signing voluntary admission?: Yes Referral Source: MD Insurance type: BCBS  Medical Screening Exam [facility] Walk-in ONLY) Medical Exam completed: No Reason for MSE not completed: Other: (pt going tfor med clearance)  Crisis Care Plan Living Arrangements: Parent Name of Psychiatrist: none Name of Therapist: none  Education Status Is patient currently in school?: No  Risk to self with the past 6 months Suicidal Ideation: No Has patient been a risk to self within the past 6 months prior to admission? : No Suicidal Intent: No Has patient had any suicidal intent within the past 6 months prior to admission? : No Is patient at risk for suicide?: Yes Suicidal  Plan?: No Has patient had any suicidal plan within the past 6 months prior to admission? : No Access to Means: No What has been your use of drugs/alcohol within the last 12 months?: see Sa section Previous Attempts/Gestures: Yes How many times?: 2 Other Self Harm Risks: SA Triggers for Past Attempts: Unpredictable (drug use) Intentional Self Injurious Behavior: None Family Suicide History: Unknown (brother died 1 year ago--not sure how) Recent stressful life event(s): Loss (Comment) (brother's death) Persecutory voices/beliefs?: No Depression: Yes Depression Symptoms: Insomnia, Loss of interest in usual pleasures, Feeling worthless/self pity, Feeling angry/irritable Substance abuse history and/or treatment for substance abuse?: Yes Suicide prevention information given to non-admitted patients: Yes  Risk to Others within the past 6 months Homicidal Ideation: No Does patient have any lifetime risk of violence toward others beyond the six months prior to admission? : No Thoughts of Harm to Others: No Current Homicidal Intent: No Current Homicidal Plan: No Access to Homicidal Means: No History of harm to others?: No Assessment of Violence: None Noted Does patient have access to weapons?: No Criminal Charges Pending?: No Does patient  have a court date: No Is patient on probation?: No  Psychosis Hallucinations: None noted Delusions: None noted  Mental Status Report Appearance/Hygiene: Disheveled Eye Contact: Poor Motor Activity: Restlessness, Agitation Speech: Logical/coherent Level of Consciousness: Alert Mood: Anxious, Irritable Affect: Anxious, Irritable Anxiety Level: Moderate Thought Processes: Coherent, Relevant Judgement: Impaired Orientation: Person, Place, Time, Situation, Appropriate for developmental age Obsessive Compulsive Thoughts/Behaviors: None  Cognitive Functioning Concentration: Poor Memory: Recent Intact, Remote Intact IQ: Average Insight:  Poor Impulse Control: Fair Appetite: Poor Weight Loss: 0 Weight Gain: 0 Sleep: Decreased Total Hours of Sleep:  (hasn't slept in 5 hrs)  ADLScreening [facility] Assessment Services) Patient's cognitive ability adequate to safely complete daily activities?: Yes Patient able to express need for assistance with ADLs?: No Independently performs ADLs?: Yes (appropriate for developmental age)  Prior Inpatient Therapy Prior Inpatient Therapy: Yes Prior Therapy Dates: many times Prior Therapy Facilty/Provider(s): [facility] Reason for Treatment: SA  Prior Outpatient Therapy Prior Outpatient Therapy: No Does patient have an ACCT team?: No Does patient have Intensive In-House Services?  : No Does patient have [facility] services? : No Does patient have P4CC services?: No  ADL Screening (condition at time of admission) Patient's cognitive ability adequate to safely complete daily activities?: Yes Is the patient deaf or have difficulty hearing?: No Does the patient have difficulty seeing, even when wearing glasses/contacts?: No Does the patient have difficulty concentrating, remembering, or making decisions?: Yes Patient able to express need for assistance with ADLs?: No Does the patient have difficulty dressing or bathing?: No Independently performs ADLs?: Yes (appropriate for developmental age) Does the patient have difficulty walking or climbing stairs?: No Weakness of Legs: None Weakness of Arms/Hands: None  Home Assistive Devices/Equipment Home Assistive Devices/Equipment: None    Abuse/Neglect Assessment (Assessment to be complete while patient is alone) Physical Abuse: Denies Verbal Abuse: Denies Sexual Abuse: Denies Exploitation of patient/patient's resources: Denies Self-Neglect: Denies Values / Beliefs Cultural Requests During Hospitalization: None Spiritual Requests During Hospitalization: None   Advance Directives (For Healthcare) Does patient have an advance  directive?: No Would patient like information on creating an advanced directive?: No - patient declined information    Additional Information 1:1 In Past 12 Months?: No CIRT Risk: No Elopement Risk: No Does patient have medical clearance?: No     Disposition:  Disposition Initial Assessment Completed for this Encounter: Yes Disposition of Patient: Inpatient treatment program Type of inpatient treatment program:  (observation unit)  [facility] 10/19/2014 5:23 PM

## 2014-10-19 NOTE — ED Provider Notes (Signed)
CSN: [id number]     Arrival date & time 10/19/14  1733 History  By signing my name below, I, Freida Busman, attest that this documentation has been prepared under the direction and in the presence of non-physician practitioner, Jaynie Crumble, PA-C. Electronically Signed: Freida Busman, Scribe. 10/19/2014. 6:02 PM.    Chief Complaint  Patient presents with  . Medical Clearance  . Polysubstance Abuse     The history is provided by the patient. No language interpreter was used.    HPI Comments:  Gregory Andrade is a 24 y.o. male who presents to the Emergency Department for polysubstance abuse. Pt states he was sent to the ED from his PCP's office for detox. He admits to Heroin, PCP, meth, and cocaine use. He last used ~ 4 days ago. At this time his only complaint is generalized body aches. No alleviating factors noted.   Past Medical History  Diagnosis Date  . Heroin abuse   . Cocaine abuse   . Narcotic abuse   . Depression   . Anxiety   . Insomnia    Past Surgical History  Procedure Laterality Date  . Wisdom teeth     Family History  Problem Relation Age of Onset  . Alcohol abuse Paternal Uncle   . Drug abuse Paternal Uncle    Social History  Substance Use Topics  . Smoking status: Current Every Day Smoker -- 2.00 packs/day    Types: Cigarettes  . Smokeless tobacco: None  . Alcohol Use: No    Review of Systems  Constitutional: Negative for fever and chills.  Musculoskeletal: Positive for myalgias.  Psychiatric/Behavioral: Negative for suicidal ideas, hallucinations and self-injury.    Allergies  Review of patient's allergies indicates no known allergies.  Home Medications   Prior to Admission medications   Medication Sig Start Date End Date Taking? Authorizing Provider  amoxicillin (AMOXIL) 500 MG capsule  08/31/13   Historical Provider, MD  baclofen (LIORESAL) 10 MG tablet Take 10 mg by mouth as needed for muscle spasms.     Historical Provider, MD   cephALEXin (KEFLEX) 500 MG capsule  09/09/13   Historical Provider, MD  chlorhexidine (PERIDEX) 0.12 % solution  08/31/13   Historical Provider, MD  clobetasol (TEMOVATE) 0.05 % external solution  09/05/13   Historical Provider, MD  cloNIDine (CATAPRES) 0.1 MG tablet Take 1 tablet (0.1 mg total) by mouth 4 (four) times daily. Tkae 1 tablet as needed for breakthru symptoms 1-3 times daily 10/19/1992 not take if BP 90/60 or below 10/28/13   Court Joy, PA-C  dexamethasone (DECADRON) 4 MG tablet  08/31/13   Historical Provider, MD  diazepam (VALIUM) 5 MG tablet Take 5 mg by mouth every 6 (six) hours as needed for anxiety.    Historical Provider, MD  gabapentin (NEURONTIN) 100 MG capsule  08/12/13   Historical Provider, MD  hydrOXYzine (VISTARIL) 25 MG capsule Take 1 capsule (25 mg total) by mouth every 4 (four) hours as needed for anxiety. 10/21/13   Court Joy, PA-C  ketoconazole (NIZORAL) 2 % shampoo  09/05/13   Historical Provider, MD  methocarbamol (ROBAXIN-750) 750 MG tablet Take 1 tablet (750 mg total) by mouth every 8 (eight) hours as needed for muscle spasms. 10/21/13   Court Joy, PA-C  mirtazapine (REMERON SOL-TAB) 15 MG disintegrating tablet Take 1 tablet (15 mg total) by mouth at bedtime. 10/21/13 10/21/14  Court Joy, PA-C  QUEtiapine (SEROQUEL) 50 MG tablet Take one tablet at bed time  may repeat 1-2 times 11/11/13   Court Joy, PA-C   BP 128/79 mmHg  Pulse 106  Temp(Src) 98.7 F (37.1 C) (Oral)  Resp 16  SpO2 100% Physical Exam  Constitutional: He is oriented to person, place, and time. He appears well-developed and well-nourished.  HENT:  Head: Normocephalic and atraumatic.  Cardiovascular: Normal rate, regular rhythm and normal heart sounds.   Pulmonary/Chest: Effort normal and breath sounds normal. No respiratory distress. He has no wheezes.  Abdominal: He exhibits no distension.  Neurological: He is alert and oriented to person, place, and time.  Skin: Skin is  warm and dry.  Psychiatric: He has a normal mood and affect. His behavior is normal.  Nursing note and vitals reviewed.   ED Course  Procedures   DIAGNOSTIC STUDIES:  Oxygen Saturation is 100% on RA, normal by my interpretation.    COORDINATION OF CARE:  5:56 PM Discussed treatment plan with pt at bedside and pt agreed to plan.  Labs Review Labs Reviewed  COMPREHENSIVE METABOLIC PANEL - Abnormal; Notable for the following:    Total Protein 9.2 (*)    ALT 15 (*)    All other components within normal limits  CBC - Abnormal; Notable for the following:    Hemoglobin 17.1 (*)    All other components within normal limits  ETHANOL  URINE RAPID DRUG SCREEN, HOSP PERFORMED  HEPATITIS PANEL, ACUTE  RAPID HIV SCREEN (HIV 1/2 AB+AG)    Imaging Review No results found. I have personally reviewed and evaluated these images and lab results as part of my medical decision-making.   EKG Interpretation None      MDM   Final diagnoses:  Polysubstance abuse    Pt here for polysubstance detox. Pt last used 3 days ago. Detox from benzos, opioids, heroin, pcp, meth. Pt is calm, vs normal. No active withdrawal symptoms. Requesting something for pain and to be checked for HIV and hep C. States "i used some bad needles." will get labs.   7:13 PM Medically cleared.  Pt apparently has a bed at behavioral health. Will dc there.    Filed Vitals:   10/19/14 1746  BP: 128/79  Pulse: 106  Temp: 98.7 F (37.1 C)  Resp: [address] PA-C 10/20/14 1546  Leta Baptist, MD 10/21/14 [phone]

## 2014-10-19 NOTE — ED Notes (Signed)
Pt presents for Medical Clearance, due to polysubstance abuse.  Pt abuses heroin, PCP, cocaine, ketamine, oxycodone, and hydrocodone.  Pt reports "I don't really want to be here, but the psychiatrist made me.  I wanted to do this at home."  Last use, "Saturday or Sunday."  Denies SI/HI/AV.      Per [facility], Pt has a bed in the Observation unit.

## 2014-10-19 NOTE — Progress Notes (Signed)
Pt is pleasant and cooperative during the adm process. However, pt stated, "I'm really not happy about not being in here. My Dr made me do it". Pt refers to his mother as "no good" and "bitch" , stating she's not willing to help him. However, pt stated that his mother would come pick him up tonight if he was to be discharged. Pt states he has been sober since Sat the 24th of Sept. States his mother wants him to do long term detox, but that it would do "more harm than good". Initially pt was adamant about leaving this evening however, after assessment with the NP, pt is content with staying one night. Pt denies SI, HI and A/V.

## 2014-10-20 DIAGNOSIS — F1910 Other psychoactive substance abuse, uncomplicated: Secondary | ICD-10-CM | POA: Diagnosis not present

## 2014-10-20 LAB — HEPATITIS PANEL, ACUTE
HCV Ab: <0.1 s/co ratio (ref 0.0–0.9)
HEP A IGM: NEGATIVE
Hep B C IgM: NEGATIVE
Hepatitis B Surface Ag: NEGATIVE

## 2014-10-20 MED ORDER — MIRTAZAPINE 15 MG PO TBDP: 15.0000 mg | ORAL_TABLET | Freq: Every day | ORAL | Status: DC

## 2014-10-20 MED ORDER — CLONIDINE HCL 0.1 MG PO TABS: 0.1000 mg | ORAL_TABLET | Freq: Four times a day (QID) | ORAL | Status: DC

## 2014-10-20 MED ORDER — QUETIAPINE FUMARATE 50 MG PO TABS: ORAL_TABLET | ORAL | Status: DC

## 2014-10-20 NOTE — Progress Notes (Signed)
Orders received to D/C patient. Mother on way to pick patient up. Belongings sheet signed.

## 2014-10-20 NOTE — Discharge Summary (Signed)
[facility] Observation Discharge Summary  Patient Identification: Gregory Andrade MRN:  [id number] Date of Evaluation:  10/20/2014 Chief Complaint:  substance use disorder Principal Diagnosis: Substance abuse, continuous Diagnosis:   Patient Active Problem List   Diagnosis Date Noted  . Substance abuse, continuous [F19.10] 10/19/2014  . Opiate withdrawal (Opioid Withdrawal F11.23) [F11.23] 10/28/2013  . IV drug abuse [F19.10] 10/28/2013  . Polysubstance abuse [F19.10] 10/21/2013  . Opiate dependence, continuous [F11.20] 10/21/2013  . Adjustment disorder with mixed anxiety and depressed mood [F43.23] 10/21/2013  . Substance induced mood disorder [F19.94] 10/21/2013  . 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 male currently admitted to [facility] Observation Unit for substance abuse ("heroin and everything else").  History of Present Illness:   Patient was seen today.  He was pacing in the OBS unit.  He states that he is ready to be discharged and that his mother will pick him up.  Reports that he had not used any substances in about a week.  It was reported in earlier notes that patient is admamant he will not benefit and so will not participate in an inpatient program.  He was persuaded by staff to stay the night.   Patient wants to be discharged in am to his mom's home and will begin the program at [facility] in [place].   He admits to IV drug use but is planning to go to [facility] as an outpatient.  Spoke with Dr Dwyane Dee and she agrees with the plan.  Confirmed information below from TTS notes and will serve as background collateral information for patient:   Earlier today, Gregory Andrade is an 24 y.o. male who came to [facility] from [facility] [name], NP recommended he come) for substance abuse detox.  Pt is accompanied by his mom, [name], who left the room during the assessment.  Pt is currently living with mom.  Pt has [place] from [place] country" [place] the past [place], and she states he has been using "Heroin, PCP, Crystal Meth, Coke and so on", with his last use being Saturday. Pt presents with sweat visible on his shirt from withdrawals and c/o nausea, tremors and pain all over. Pt states he has not slept in 5 days even with his Seroquel and has been trying to detox on his own. Pt states he wants to begin the program at [facility] in [place], an 8-5 day program where he will have someone with him at all times for accountability, but was told he will need detox first. Pt states he is not happy to be here, but he does want help. He has no current OP provider at this time.    Pt was dressed casually, and his hair appeared disheveled. During interview, pt was anxious but cooperative, made poor eye contact, laughed inappropriately at times, and his anxious but almost flippant affect did not match his circumstance, so it is difficult to assess how serious he is about getting help. At one point pt was able to make eye contact and state seriously his plans for treatment. Pt had restless movement and normal thought content. Pt denies SI, HI, AVH. He was oriented x 4. He states he has a history of 1-2 suicide attempt "years ago" and "about 8" unintentional overdoses. He has had multiple treatment stays at the [facility], and his longest sobriety is 100 days when pt was 32. Upon arrival, mom became tearful in asking for  hlep because she states that pt's brother died a year ago, and "there is a lot of grief in the family" Pt denies that brother overdosed, but said he was an [name] and was found dead in the bathroom with unknown cause.  Interval: Patient is seen tonight at the  [facility] Observation Unit.  Patient is alert, oriented to time, place, person and situation. Patient denies current symptoms of depression, anxiety, psychosis, and has no evidence of hallucinations or paranoid delusions.  Patient denies current suicidal/homicidal ideation, intention or plans. He denies any withdrawal symptoms. He denies sweating, body aches, cramping, nervousness, cold sweats, nausea and vomiting.   He reports last use of Heroine was 4 days ago but PCP was 2-3 weeks ago, Crack was 2-3 weeks ago and Meth was 2 weeks ago. Patient states " I was detoxing my self from all these drugs. I told my doctor that I could not sleep for 5 days and that I am detoxing from Heroine, cocaine, PCP & Crystal Meth.  She panicked and insists that I come to the ED for medical clearance.  I am cleared and I am told to come to this "f... Place".  I am uncomfortable here and staying here will drive me to using again".  He explains his plans to begin treatment at [facility] in [place] and is not open to any inpatient treatment at this time. He explains he had completed multiple  inpatient treatments at the [facility] in the past but ended up using after each inpatient treatment. Patient states "I have money and can not stay in places like this". Patient states he is waiting for his dad to ok and pay for the [facility] program.    Associated Signs/Symptoms: Depression Symptoms:  insomnia, denies any other symptoms (Hypo) Manic Symptoms:  NA Anxiety Symptoms:  NA Psychotic Symptoms:  NA PTSD Symptoms: NA Total Time spent with patient: 30 minutes  Past Psychiatric History: Long history of Polysubstance abuse  Risk to Self: Suicidal Ideation: No Suicidal Intent: No Is patient at risk for suicide?: Yes Suicidal Plan?: No Access to Means: No What has been your use of drugs/alcohol within the last 12 months?: see Sa section How many times?: 2 Other Self Harm Risks: SA Triggers for Past Attempts: Unpredictable (drug use) Intentional Self Injurious Behavior: None Risk to Others: Homicidal Ideation: No Thoughts of Harm to Others: No Current Homicidal Intent: No Current Homicidal Plan: No Access to Homicidal Means:  No History of harm to others?: No Assessment of Violence: None Noted Does patient have access to weapons?: No Criminal Charges Pending?: No Does patient have a court date: No Prior Inpatient Therapy: Prior Inpatient Therapy: Yes Prior Therapy Dates: many times Prior Therapy Facilty/Provider(s): [facility] Reason for Treatment: SA Prior Outpatient Therapy: Prior Outpatient Therapy: No Does patient have an ACCT team?: No Does patient have Intensive In-House Services?  : No Does patient have [facility] services? : No Does patient have P4CC services?: No  Alcohol Screening: 1. How often do you have a drink containing alcohol?: Monthly or less 2. How many drinks containing alcohol do you have on a typical day when you are drinking?: 5 or 6 3. How often do you have six or more drinks on one occasion?: Less than monthly Preliminary Score: 3 4. How often during the last year have you found that you were not able to stop drinking once you had started?: Less than monthly 6. How often during the last year have you needed  a first drink in the morning to get yourself going after a heavy drinking session?: Less than monthly 7. How often during the last year have you had a feeling of guilt of remorse after drinking?: Never 8. How often during the last year have you been unable to remember what happened the night before because you had been drinking?: Never 9. Have you or someone else been injured as a result of your drinking?: No 10. Has a relative or friend or a doctor or another health worker been concerned about your drinking or suggested you cut down?: No Alcohol Use Disorder Identification Test Final Score (AUDIT): 6 Brief Intervention: Patient declined brief intervention Substance Abuse History in the last 12 months:  Yes.   Consequences of Substance Abuse: Legal Consequences:  Arrested in [place] Family Consequences:  strained family relationship, brother died last year of possible drug  related  Withdrawal Symptoms:   restless & insomnia Previous Psychotropic Medications: Yes  Psychological Evaluations: No  Past Medical History:  Past Medical History  Diagnosis Date  . Heroin abuse   . Cocaine abuse   . Narcotic abuse   . Depression   . Anxiety   . Insomnia     Past Surgical History  Procedure Laterality Date  . Wisdom teeth     Family History:  Family History  Problem Relation Age of Onset  . Alcohol abuse Paternal Uncle   . Drug abuse Paternal Uncle    Family Psychiatric  History: SA Social History:  History  Alcohol Use No     History  Drug Use  . Yes  . Special: Heroin, Ketamine, Oxycodone, Hydrocodone, Cocaine, Methamphetamines    Comment: heroin, narcotics    Social History   Social History  . Marital Status: Single    Spouse Name: N/A  . Number of Children: N/A  . Years of Education: N/A   Social History Main Topics  . Smoking status: Current Every Day Smoker -- 2.00 packs/day    Types: Cigarettes  . Smokeless tobacco: None  . Alcohol Use: No  . Drug Use: Yes    Special: Heroin, Ketamine, Oxycodone, Hydrocodone, Cocaine, Methamphetamines     Comment: heroin, narcotics  . Sexual Activity: Not Currently   Other Topics Concern  . None   Social History Narrative   Additional Social History:    Pain Medications: heroiin Prescriptions: denies Over the Counter: none History of alcohol / drug use?: Yes Longest period of sobriety (when/how long): the past 7 days Negative Consequences of Use: Personal relationships, Scientist, research (physical sciences), Work / School Withdrawal Symptoms: Sweats Name of Substance 1: heroin 1-1/2 gram daily, las use Saturday, began use at age 24 1 - Age of First Use: 18 yrs 1 - Last Use / Amount: 6 days ago Name of Substance 2: pcp 2 - Age of First Use: 21 2 - Amount (size/oz): "variable" 2 - Frequency: daily 2 - Duration: years 2 - Last Use / Amount: 1 week ago Name of Substance 3: crystal meth 3 - Age of First Use: 18 3 -  Amount (size/oz): variable 3 - Frequency: daily 3 - Duration: years 3 - Last Use / Amount: 1 week Name of Substance 4: cocaine 4 - Age of First Use: 20 4 - Amount (size/oz): variable 4 - Frequency: was using daily 4 - Duration: years 4 - Last Use / Amount: 3 weeks ago            Allergies:   Allergies  Allergen Reactions  .  Suboxone [Buprenorphine Hcl-Naloxone Hcl] Anaphylaxis and Nausea And Vomiting    Fever    Lab Results:  Results for orders placed or performed during the hospital encounter of 10/19/14 (from the past 48 hour(s))  Urine rapid drug screen (hosp performed) (Not at [facility])     Status: None   Collection Time: 10/19/14  6:00 PM  Result Value Ref Range   Opiates NONE DETECTED NONE DETECTED   Cocaine NONE DETECTED NONE DETECTED   Benzodiazepines NONE DETECTED NONE DETECTED   Amphetamines NONE DETECTED NONE DETECTED   Tetrahydrocannabinol NONE DETECTED NONE DETECTED   Barbiturates NONE DETECTED NONE DETECTED    Comment:        DRUG SCREEN FOR MEDICAL PURPOSES ONLY.  IF CONFIRMATION IS NEEDED FOR ANY PURPOSE, NOTIFY LAB WITHIN 5 DAYS.        LOWEST DETECTABLE LIMITS FOR URINE DRUG SCREEN Drug Class       Cutoff (ng/mL) Amphetamine      1000 Barbiturate      200 Benzodiazepine   694 Tricyclics       503 Opiates          300 Cocaine          300 THC              50   Comprehensive metabolic panel     Status: Abnormal   Collection Time: 10/19/14  6:16 PM  Result Value Ref Range   Sodium 141 135 - 145 mmol/L   Potassium 4.3 3.5 - 5.1 mmol/L   Chloride 105 101 - 111 mmol/L   CO2 25 22 - 32 mmol/L   Glucose, Bld 86 65 - 99 mg/dL   BUN 18 6 - 20 mg/dL   Creatinine, Ser 1.05 0.61 - 1.24 mg/dL   Calcium 9.5 8.9 - 10.3 mg/dL   Total Protein 9.2 (H) 6.5 - 8.1 g/dL   Albumin 4.7 3.5 - 5.0 g/dL   AST 17 15 - 41 U/L   ALT 15 (L) 17 - 63 U/L   Alkaline Phosphatase 70 38 - 126 U/L   Total Bilirubin 0.4 0.3 - 1.2 mg/dL   GFR calc non Af Amer >60 >60 mL/min    GFR calc Af Amer >60 >60 mL/min    Comment: (NOTE) The eGFR has been calculated using the CKD EPI equation. This calculation has not been validated in all clinical situations. eGFR's persistently <60 mL/min signify possible Chronic Kidney Disease.    Anion gap 11 5 - 15  Ethanol (ETOH)     Status: None   Collection Time: 10/19/14  6:16 PM  Result Value Ref Range   Alcohol, Ethyl (B) <5 <5 mg/dL    Comment:        LOWEST DETECTABLE LIMIT FOR SERUM ALCOHOL IS 5 mg/dL FOR MEDICAL PURPOSES ONLY   CBC     Status: Abnormal   Collection Time: 10/19/14  6:16 PM  Result Value Ref Range   WBC 10.5 4.0 - 10.5 K/uL   RBC 5.43 4.22 - 5.81 MIL/uL   Hemoglobin 17.1 (H) 13.0 - 17.0 g/dL   HCT 48.0 39.0 - 52.0 %   MCV 88.4 78.0 - 100.0 fL   MCH 31.5 26.0 - 34.0 pg   MCHC 35.6 30.0 - 36.0 g/dL   RDW 12.1 11.5 - 15.5 %   Platelets 364 150 - 400 K/uL  Hepatitis panel, acute     Status: None   Collection Time: 10/19/14  6:17 PM  Result Value  Ref Range   Hepatitis B Surface Ag Negative Negative   HCV Ab <0.1 0.0 - 0.9 s/co ratio    Comment: (NOTE)                                  Negative:     < 0.8                             Indeterminate: 0.8 - 0.9                                  Positive:     > 0.9 The CDC recommends that a positive HCV antibody result be followed up with a HCV Nucleic Acid Amplification test (161096). Performed At: [facility] [place], [place] [id number] Lindon Romp MD BJ:[id number]    Hep A IgM Negative Negative   Hep B C IgM Negative Negative  Rapid HIV screen (HIV 1/2 Ab+Ag)     Status: None   Collection Time: 10/19/14  6:17 PM  Result Value Ref Range   HIV-1 P24 Antigen - HIV24 NON REACTIVE NON REACTIVE   HIV 1/2 Antibodies NON REACTIVE NON REACTIVE   Interpretation (HIV Ag Ab)      A non reactive test result means that HIV 1 or HIV 2 antibodies and HIV 1 p24 antigen were not detected in the specimen.    Comment: RESULT CALLED  TO, READ BACK BY AND VERIFIED WITH: Cathe Mons [id number] @ 1920 BY J SCOTTON     Metabolic Disorder Labs:  No results found for: HGBA1C, MPG No results found for: PROLACTIN No results found for: CHOL, TRIG, HDL, CHOLHDL, VLDL, LDLCALC  Current Medications: Current Facility-Administered Medications  Medication Dose Route Frequency Provider Last Rate Last Dose  . acetaminophen (TYLENOL) tablet 650 mg  650 mg Oral Q6H PRN Harriet Butte, NP      . alum & mag hydroxide-simeth (MAALOX/MYLANTA) 200-200-20 MG/5ML suspension 30 mL  30 mL Oral Q4H PRN Harriet Butte, NP      . magnesium hydroxide (MILK OF MAGNESIA) suspension 30 mL  30 mL Oral Daily PRN Harriet Butte, NP      . QUEtiapine (SEROQUEL) tablet 100-400 mg  100-400 mg Oral QHS PRN Harriet Butte, NP   100 mg at 10/20/14 0020   Current Outpatient Prescriptions  Medication Sig Dispense Refill  . cloNIDine (CATAPRES) 0.1 MG tablet Take 1 tablet (0.1 mg total) by mouth 4 (four) times daily. Tkae 1 tablet as needed for breakthru symptoms 1-3 times daily 1990/05/23 not take if BP 90/60 or below 60 tablet 1  . mirtazapine (REMERON SOL-TAB) 15 MG disintegrating tablet Take 1 tablet (15 mg total) by mouth at bedtime. 30 tablet 2  . QUEtiapine (SEROQUEL) 50 MG tablet Take one tablet at bed time Francena Zender repeat 1-2 times 90 tablet 2   PTA Medications: No prescriptions prior to admission    Musculoskeletal: Strength & Muscle Tone: within normal limits Gait & Station: normal Patient leans: Right  Psychiatric Specialty Exam: Physical Exam  Eyes: No scleral icterus.   ROS  BP 99/59 mmHg  Pulse 129  Temp(Src) 97.9 F (36.6 C) (Oral)  Resp 16  Ht 5' 9.5" (1.765 m)  Wt 67.132 kg (148 lb)  BMI 21.55 kg/m2  General Appearance: Casual and Fairly Groomed  Engineer, water::  Fair  Speech:  Clear and Coherent and Normal Rate  Volume:  Normal  Mood:  Euthymic and some inappropriate laughs  Affect:  Congruent  Thought Process:  Goal Directed,  Intact and Logical  Orientation:  Full (Time, Place, and Person)  Thought Content:  WDL  Suicidal Thoughts:  No  Homicidal Thoughts:  No  Memory:  Immediate;   Good Recent;   Good Remote;   Good  Judgement:  Impaired  [facility]:  Shallow  Psychomotor Activity:  Normal    Concentration:  Fair  Recall:  Good  Fund of Knowledge:Good  Language: Good  Akathisia:  Negative  Handed:  Right  AIMS (if indicated):     Assets:  Communication Skills Desire for Improvement Financial Resources/Insurance Housing Leisure Time Physical Health Resilience Social Support  ADL's:  Intact  Cognition: WNL  Sleep:  Number of Hours: 5    Treatment Plan Summary: 1. Daily contact with patient to assess and evaluate symptoms and progress in treatment. 2. Medication management 3. After discharge from [facility], Patient wants to participate in substance abuse treatment at [facility] in [place].  Patient reports he has made all the necessary arrangement and just needed to detox. 4. Patient not willing to consider inpatient at this time.   Observation Level/Precautions:  Continuous Observation  Laboratory:  see lab results  Psychotherapy:  OBS unit  Medications:  As per medlist  Consultations:  As needed  Discharge Concerns:  Safety and sobriety   Estimated LOS: OBS unit  Other:     I certify that inpatient services furnished can reasonably be expected to improve the patient's condition.    [facility], [place] 9/30/201610:43 AM

## 2014-10-20 NOTE — Progress Notes (Signed)
Patient d/c'd to go home with mother to participate in [facility] rehab.

## 2014-10-20 NOTE — BH Assessment (Signed)
[facility] Assessment Progress Note This writer spoke at length with patient in reference to his recovery efforts and discharge plans. Patient states he feels his parents are trying to encourage him to attend a two year [place] program which he is not interested in. Patient stated he was more interested in the "[facility]" program in [place]. This Clinical research associate contacted the [facility] program and spoke with Vianne Bulls who informed patient to contact her to schedule an appointment, this writer offered to set up a appointment for the patient but he stated he was unsure of his plans/schdule. This Clinical research associate spoke to [employer].D. who had seen this patient some time ago with [facility] suggesting follow up care with a residential program based on the patients history and inability to complete any outpatient program. This Clinical research associate spoke with patient's mother upon discharge to discuss discharge follow up plans and gave patient outpatient/inpatient resources encouraging patient to follow up with any of the providers listed.

## 2014-10-20 NOTE — Progress Notes (Signed)
D: Patient is alert and oriented x 4. Patient denies SI, HI, and AVH. Patient is anxious and restless. Patient called mother to pick him up. Explained to patient that he does not have orders to be discharged yet. Patria Mane NP saw patient. Orders to be received. Patient is frequently asking for snacks, drinks, and pacing in room.   A: Verbal and emotional support given. Patient safety maintain with constant observation and safety checks q 15 min.   R: Patient is insistent on leaving right away. Safety and dignity maintained.

## 2014-10-20 NOTE — H&P (Signed)
[facility] Observation Admission Assessment Adult  Patient Identification: DOCK BACCAM MRN:  [id number] Date of Evaluation:  10/20/2014 Chief Complaint:  substance use disorder Principal Diagnosis: <principal problem not specified> Diagnosis:   Patient Active Problem List   Diagnosis Date Noted  . Substance abuse, continuous [F19.10] 10/19/2014  . Opiate withdrawal (Opioid Withdrawal F11.23) [F11.23] 10/28/2013  . IV drug abuse [F19.10] 10/28/2013  . Polysubstance abuse [F19.10] 10/21/2013  . Opiate dependence, continuous [F11.20] 10/21/2013  . Adjustment disorder with mixed anxiety and depressed mood [F43.23] 10/21/2013  . Substance induced mood disorder [F19.94] 10/21/2013  . 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 male currently admitted to [facility] Observation Unit.   History of Present Illness::  Earlier today, Gregory Andrade is an 24 y.o. male who came to [facility] from [facility] Gregory Furlong, NP recommended he come) for substance abuse detox. Pt is accompanied by his mom, Gregory Andrade, who left the room during the assessment. Pt is currently living with mom. Pt has arrived [place] from [place] country" for the past year, and she states he has been using "Heroin, PCP, Crystal Meth, Coke and so on", with his last use being Saturday. Pt presents with sweat visible on his shirt from withdrawals and c/o nausea, tremors and pain all over. Pt states he has not slept in 5 days even with his Seroquel and has been trying to detox on his own. Pt states he wants to begin the program at [facility] in [place], an 8-5 day program where he will have someone with him at all times for accountability, but was told he will need detox first. Pt states he is not happy to be here, but he does want help. He has no current OP provider at this time.  Pt was dressed casually, and his hair appeared disheveled. During interview,  pt was anxious but cooperative, made poor eye contact, laughed inappropriately at times, and his anxious but almost flippant affect did not match his circumstance, so it is difficult to assess how serious he is about getting help. At one point pt was able to make eye contact and state seriously his plans for treatment. Pt had restless movement and normal thought content. Pt denies SI, HI, AVH. He was oriented x 4. He states he has a history of 1-2 suicide attempt "years ago" and "about 8" unintentional overdoses. He has had multiple treatment stays at the [facility], and his longest sobriety is 100 days when pt was 55. Upon arrival, mom became tearful in asking for hlep because she states that pt's brother died a year ago, and "there is a lot of grief in the family" Pt denies that brother overdosed, but said he was an [name] and was found dead in the bathroom with unknown cause.  Interval: Patient is seen tonight at the  [facility] Observation Unit.  Patient is alert, oriented to time, place, person and situation. Patient denies current symptoms of depression, anxiety, psychosis, and has no evidence of hallucinations or paranoid delusions. Patient denies current suicidal/homicidal ideation, intention or plans. He denies any withdrawal symptoms. He denies sweating, body aches, cramping, nervousness, cold sweats, nausea and vomiting.   He reports last use of Heroine was 4 days ago but PCP was 2-3 weeks ago, Crack was 2-3 weeks ago and Meth was 2 weeks ago. Patient states " I was detoxing my self from all these drugs. I told my doctor that  I could not sleep for 5 days and that I am detoxing from Heroine, cocaine, PCP & Crystal Meth.  She panicked and insists that I come to the ED for medical clearance.  I am cleared and I am told to come to this "f... Place".  I am uncomfortable here and staying here will drive me to using again".  He explains his plans to begin treatment at [facility] in [place] and  is not open to any inpatient treatment at this time. He explains he had completed multiple  inpatient treatments at the [facility] in the past but ended up using after each inpatient treatment. Patient states "I have money and can not stay in places like this". Patient states he is waiting for his dad to ok and pay for the [facility] program.    Patient is admat he will not benefit and so will not participat in an inpatient program.  He was persuaded by staff to stay the night.   Patient wants to be discharged in am to his mom's home and will begin the program at [facility] in [place].      Associated Signs/Symptoms: Depression Symptoms:  insomnia, denies any other symptoms (Hypo) Manic Symptoms:  NA Anxiety Symptoms:  NA Psychotic Symptoms:  NA PTSD Symptoms: NA Total Time spent with patient: 30 minutes  Past Psychiatric History: Long history of Polysubstance abuse  Risk to Self: Suicidal Ideation: No Suicidal Intent: No Is patient at risk for suicide?: Yes Suicidal Plan?: No Access to Means: No What has been your use of drugs/alcohol within the last 12 months?: see Sa section How many times?: 2 Other Self Harm Risks: SA Triggers for Past Attempts: Unpredictable (drug use) Intentional Self Injurious Behavior: None Risk to Others: Homicidal Ideation: No Thoughts of Harm to Others: No Current Homicidal Intent: No Current Homicidal Plan: No Access to Homicidal Means: No History of harm to others?: No Assessment of Violence: None Noted Does patient have access to weapons?: No Criminal Charges Pending?: No Does patient have a court date: No Prior Inpatient Therapy: Prior Inpatient Therapy: Yes Prior Therapy Dates: many times Prior Therapy Facilty/Provider(s): [facility] Reason for Treatment: SA Prior Outpatient Therapy: Prior Outpatient Therapy: No Does patient have an ACCT team?: No Does patient have Intensive In-House Services?  : No Does patient have [facility]  services? : No Does patient have P4CC services?: No  Alcohol Screening: 1. How often do you have a drink containing alcohol?: Monthly or less 2. How many drinks containing alcohol do you have on a typical day when you are drinking?: 5 or 6 3. How often do you have six or more drinks on one occasion?: Less than monthly Preliminary Score: 3 4. How often during the last year have you found that you were not able to stop drinking once you had started?: Less than monthly 6. How often during the last year have you needed a first drink in the morning to get yourself going after a heavy drinking session?: Less than monthly 7. How often during the last year have you had a feeling of guilt of remorse after drinking?: Never 8. How often during the last year have you been unable to remember what happened the night before because you had been drinking?: Never 9. Have you or someone else been injured as a result of your drinking?: No 10. Has a relative or friend or a doctor or another health worker been concerned about your drinking or suggested you cut down?: No Alcohol Use  Disorder Identification Test Final Score (AUDIT): 6 Brief Intervention: Patient declined brief intervention Substance Abuse History in the last 12 months:  Yes.   Consequences of Substance Abuse: Legal Consequences:  Arrested in [place] Family Consequences:  strained family relationship, brother died last year of possible drug related  Withdrawal Symptoms:   restless & insomnia Previous Psychotropic Medications: Yes  Psychological Evaluations: No  Past Medical History:  Past Medical History  Diagnosis Date  . Heroin abuse   . Cocaine abuse   . Narcotic abuse   . Depression   . Anxiety   . Insomnia     Past Surgical History  Procedure Laterality Date  . Wisdom teeth     Family History:  Family History  Problem Relation Age of Onset  . Alcohol abuse Paternal Uncle   . Drug abuse Paternal Uncle    Family Psychiatric   History: SA Social History:  History  Alcohol Use No     History  Drug Use  . Yes  . Special: Heroin, Ketamine, Oxycodone, Hydrocodone, Cocaine, Methamphetamines    Comment: heroin, narcotics    Social History   Social History  . Marital Status: Single    Spouse Name: N/A  . Number of Children: N/A  . Years of Education: N/A   Social History Main Topics  . Smoking status: Current Every Day Smoker -- 2.00 packs/day    Types: Cigarettes  . Smokeless tobacco: None  . Alcohol Use: No  . Drug Use: Yes    Special: Heroin, Ketamine, Oxycodone, Hydrocodone, Cocaine, Methamphetamines     Comment: heroin, narcotics  . Sexual Activity: Not Currently   Other Topics Concern  . None   Social History Narrative   Additional Social History:    Pain Medications: heroiin Prescriptions: denies Over the Counter: none History of alcohol / drug use?: Yes Longest period of sobriety (when/how long): the past 7 days Negative Consequences of Use: Personal relationships, Scientist, research (physical sciences), Work / School Withdrawal Symptoms: Sweats Name of Substance 1: heroin 1-1/2 gram daily, las use Saturday, began use at age 12 1 - Age of First Use: 18 yrs 1 - Last Use / Amount: 6 days ago Name of Substance 2: pcp 2 - Age of First Use: 21 2 - Amount (size/oz): "variable" 2 - Frequency: daily 2 - Duration: years 2 - Last Use / Amount: 1 week ago Name of Substance 3: crystal meth 3 - Age of First Use: 18 3 - Amount (size/oz): variable 3 - Frequency: daily 3 - Duration: years 3 - Last Use / Amount: 1 week Name of Substance 4: cocaine 4 - Age of First Use: 20 4 - Amount (size/oz): variable 4 - Frequency: was using daily 4 - Duration: years 4 - Last Use / Amount: 3 weeks ago            Allergies:   Allergies  Allergen Reactions  . Suboxone [Buprenorphine Hcl-Naloxone Hcl] Anaphylaxis and Nausea And Vomiting    Fever    Lab Results:  Results for orders placed or performed during the hospital  encounter of 10/19/14 (from the past 48 hour(s))  Urine rapid drug screen (hosp performed) (Not at [facility])     Status: None   Collection Time: 10/19/14  6:00 PM  Result Value Ref Range   Opiates NONE DETECTED NONE DETECTED   Cocaine NONE DETECTED NONE DETECTED   Benzodiazepines NONE DETECTED NONE DETECTED   Amphetamines NONE DETECTED NONE DETECTED   Tetrahydrocannabinol NONE DETECTED NONE DETECTED  Barbiturates NONE DETECTED NONE DETECTED    Comment:        DRUG SCREEN FOR MEDICAL PURPOSES ONLY.  IF CONFIRMATION IS NEEDED FOR ANY PURPOSE, NOTIFY LAB WITHIN 5 DAYS.        LOWEST DETECTABLE LIMITS FOR URINE DRUG SCREEN Drug Class       Cutoff (ng/mL) Amphetamine      1000 Barbiturate      200 Benzodiazepine   852 Tricyclics       778 Opiates          300 Cocaine          300 THC              50   Comprehensive metabolic panel     Status: Abnormal   Collection Time: 10/19/14  6:16 PM  Result Value Ref Range   Sodium 141 135 - 145 mmol/L   Potassium 4.3 3.5 - 5.1 mmol/L   Chloride 105 101 - 111 mmol/L   CO2 25 22 - 32 mmol/L   Glucose, Bld 86 65 - 99 mg/dL   BUN 18 6 - 20 mg/dL   Creatinine, Ser 1.05 0.61 - 1.24 mg/dL   Calcium 9.5 8.9 - 10.3 mg/dL   Total Protein 9.2 (H) 6.5 - 8.1 g/dL   Albumin 4.7 3.5 - 5.0 g/dL   AST 17 15 - 41 U/L   ALT 15 (L) 17 - 63 U/L   Alkaline Phosphatase 70 38 - 126 U/L   Total Bilirubin 0.4 0.3 - 1.2 mg/dL   GFR calc non Af Amer >60 >60 mL/min   GFR calc Af Amer >60 >60 mL/min    Comment: (NOTE) The eGFR has been calculated using the CKD EPI equation. This calculation has not been validated in all clinical situations. eGFR's persistently <60 mL/min signify possible Chronic Kidney Disease.    Anion gap 11 5 - 15  Ethanol (ETOH)     Status: None   Collection Time: 10/19/14  6:16 PM  Result Value Ref Range   Alcohol, Ethyl (B) <5 <5 mg/dL    Comment:        LOWEST DETECTABLE LIMIT FOR SERUM ALCOHOL IS 5 mg/dL FOR MEDICAL PURPOSES  ONLY   CBC     Status: Abnormal   Collection Time: 10/19/14  6:16 PM  Result Value Ref Range   WBC 10.5 4.0 - 10.5 K/uL   RBC 5.43 4.22 - 5.81 MIL/uL   Hemoglobin 17.1 (H) 13.0 - 17.0 g/dL   HCT 48.0 39.0 - 52.0 %   MCV 88.4 78.0 - 100.0 fL   MCH 31.5 26.0 - 34.0 pg   MCHC 35.6 30.0 - 36.0 g/dL   RDW 12.1 11.5 - 15.5 %   Platelets 364 150 - 400 K/uL  Rapid HIV screen (HIV 1/2 Ab+Ag)     Status: None   Collection Time: 10/19/14  6:17 PM  Result Value Ref Range   HIV-1 P24 Antigen - HIV24 NON REACTIVE NON REACTIVE   HIV 1/2 Antibodies NON REACTIVE NON REACTIVE   Interpretation (HIV Ag Ab)      A non reactive test result means that HIV 1 or HIV 2 antibodies and HIV 1 p24 antigen were not detected in the specimen.    Comment: RESULT CALLED TO, READ BACK BY AND VERIFIED WITH: Cathe Mons [id number] @ 1920 BY J SCOTTON     Metabolic Disorder Labs:  No results found for: HGBA1C, MPG No results found for: PROLACTIN No results  found for: CHOL, TRIG, HDL, CHOLHDL, VLDL, LDLCALC  Current Medications: Current Facility-Administered Medications  Medication Dose Route Frequency Provider Last Rate Last Dose  . acetaminophen (TYLENOL) tablet 650 mg  650 mg Oral Q6H PRN Harriet Butte, NP      . alum & mag hydroxide-simeth (MAALOX/MYLANTA) 200-200-20 MG/5ML suspension 30 mL  30 mL Oral Q4H PRN Harriet Butte, NP      . magnesium hydroxide (MILK OF MAGNESIA) suspension 30 mL  30 mL Oral Daily PRN Harriet Butte, NP      . QUEtiapine (SEROQUEL) tablet 100-400 mg  100-400 mg Oral QHS PRN Harriet Butte, NP   100 mg at 10/20/14 0020   PTA Medications: Prescriptions prior to admission  Medication Sig Dispense Refill Last Dose  . cloNIDine (CATAPRES) 0.1 MG tablet Take 1 tablet (0.1 mg total) by mouth 4 (four) times daily. Tkae 1 tablet as needed for breakthru symptoms 1-3 times daily 1992-[birth year] not take if BP 90/60 or below (Patient not taking: Reported on 10/19/2014) 60 tablet 1 Not Taking at  Unknown time  . hydrOXYzine (VISTARIL) 25 MG capsule Take 1 capsule (25 mg total) by mouth every 4 (four) hours as needed for anxiety. (Patient not taking: Reported on 10/19/2014) 90 capsule 1 Not Taking at Unknown time  . methocarbamol (ROBAXIN-750) 750 MG tablet Take 1 tablet (750 mg total) by mouth every 8 (eight) hours as needed for muscle spasms. (Patient not taking: Reported on 10/19/2014) 45 tablet 1 Not Taking at Unknown time  . mirtazapine (REMERON SOL-TAB) 15 MG disintegrating tablet Take 1 tablet (15 mg total) by mouth at bedtime. (Patient not taking: Reported on 10/19/2014) 30 tablet 2 Not Taking at Unknown time  . QUEtiapine (SEROQUEL) 100 MG tablet Take 100-400 mg by mouth at bedtime as needed (for sleep).   10/18/2014 at Unknown time  . QUEtiapine (SEROQUEL) 50 MG tablet Take one tablet at bed time may repeat 1-2 times (Patient not taking: Reported on 10/19/2014) 90 tablet 2 Not Taking at Unknown time  . sulfamethoxazole-trimethoprim (BACTRIM DS,SEPTRA DS) 800-160 MG tablet Take 1 tablet by mouth 2 (two) times daily. For 10 days   10/19/2014 at am    Musculoskeletal: Strength & Muscle Tone: within normal limits Gait & Station: normal Patient leans: Right  Psychiatric Specialty Exam: Physical Exam  Eyes: No scleral icterus.    ROS  Height 5' 9.5" (1.765 m), weight 67.132 kg (148 lb).Body mass index is 21.55 kg/(m^2).  General Appearance: Casual and Fairly Groomed  Engineer, water::  Fair  Speech:  Clear and Coherent and Normal Rate  Volume:  Normal  Mood:  Euthymic and some inappropriate laughs  Affect:  Congruent  Thought Process:  Goal Directed, Intact and Logical  Orientation:  Full (Time, Place, and Person)  Thought Content:  WDL  Suicidal Thoughts:  No  Homicidal Thoughts:  No  Memory:  Immediate;   Good Recent;   Good Remote;   Good  Judgement:  Impaired  Insight:  Shallow  Psychomotor Activity:  Normal    Concentration:  Fair  Recall:  Good  Fund of Knowledge:Good   Language: Good  Akathisia:  Negative  Handed:  Right  AIMS (if indicated):     Assets:  Communication Skills Desire for Improvement Financial Resources/Insurance Housing Leisure Time Physical Health Resilience Social Support  ADL's:  Intact  Cognition: WNL  Sleep:        Treatment Plan Summary: 1. Daily contact with patient to assess and  evaluate symptoms and progress in treatment. 2. Medication management 3. After discharge from [facility], Patient wants to participate in substance abuse treatment at [facility] in [place].  Patient reports he has made all the necessary arrangement and just needed to detox. 4. Patient not willing to consider inpatient at this time   Observation Level/Precautions:  Continuous Observation  Laboratory:  see lab results  Psychotherapy:  OBS unit  Medications:  As per medlist  Consultations:  As needed  Discharge Concerns:  Safety and sobriety   Estimated LOS: OBS unit  Other:     I certify that inpatient services furnished can reasonably be expected to improve the patient's condition.    Samantha Crimes, PMHNP-BC 9/30/20162:18 AM

## 2014-10-31 ENCOUNTER — Emergency Department (HOSPITAL_COMMUNITY)
Admission: EM | Admit: 2014-10-31 | Discharge: 2014-10-31 | Disposition: A | Discharge status: Home / Self Care | Payer: BLUE CROSS/BLUE SHIELD | Attending: Emergency Medicine | Admitting: Emergency Medicine

## 2014-10-31 ENCOUNTER — Emergency Department (HOSPITAL_COMMUNITY): Payer: BLUE CROSS/BLUE SHIELD

## 2014-10-31 ENCOUNTER — Encounter (HOSPITAL_COMMUNITY): Payer: Self-pay | Admitting: *Deleted

## 2014-10-31 DIAGNOSIS — N451 Epididymitis: Secondary | ICD-10-CM | POA: Diagnosis not present

## 2014-10-31 DIAGNOSIS — Z8669 Personal history of other diseases of the nervous system and sense organs: Secondary | ICD-10-CM | POA: Insufficient documentation

## 2014-10-31 DIAGNOSIS — F329 Major depressive disorder, single episode, unspecified: Secondary | ICD-10-CM | POA: Insufficient documentation

## 2014-10-31 DIAGNOSIS — F419 Anxiety disorder, unspecified: Secondary | ICD-10-CM | POA: Diagnosis not present

## 2014-10-31 DIAGNOSIS — Z720 Tobacco use: Secondary | ICD-10-CM | POA: Insufficient documentation

## 2014-10-31 DIAGNOSIS — N50812 Left testicular pain: Secondary | ICD-10-CM

## 2014-10-31 MED ORDER — CEFTRIAXONE SODIUM 250 MG IJ SOLR
250.0000 mg | Freq: Once | INTRAMUSCULAR | Status: AC
  Administered 2014-10-31: 250 mg via INTRAMUSCULAR
  Filled 2014-10-31: qty 250

## 2014-10-31 MED ORDER — DOXYCYCLINE HYCLATE 100 MG PO TABS
100.0000 mg | ORAL_TABLET | Freq: Once | ORAL | Status: AC
  Administered 2014-10-31: 100 mg via ORAL
  Filled 2014-10-31: qty 1

## 2014-10-31 MED ORDER — DOXYCYCLINE HYCLATE 100 MG PO CAPS: 100.0000 mg | ORAL_CAPSULE | Freq: Two times a day (BID) | ORAL | Status: DC

## 2014-10-31 MED ORDER — LIDOCAINE HCL (PF) 1 % IJ SOLN
INTRAMUSCULAR | Status: AC
  Filled 2014-10-31: qty 5

## 2014-10-31 NOTE — Discharge Instructions (Signed)
Take doxycycline as directed until gone. Refer to attached documents for more information. Return to the ED with worsening or concerning symptoms.

## 2014-10-31 NOTE — ED Provider Notes (Signed)
CSN: [id number]     Arrival date & time 10/31/14  1546 History   First MD Initiated Contact with Patient 10/31/14 2022     Chief Complaint  Patient presents with  . Testicle Pain     (Consider location/radiation/quality/duration/timing/severity/associated sxs/prior Treatment) HPI Comments: Patient is a 24 year old male who presents with a 3 week history of left testicle pain. The pain is aching and severe and does not radiate. Palpation makes the pain worse. No alleviating factors. No penile discharged or other associated symptoms.   Patient is a 24 y.o. male presenting with testicular pain. The history is provided by the patient. No language interpreter was used.  Testicle Pain This is a new problem. The current episode started 1 to 4 weeks ago. The problem occurs constantly. The problem has been unchanged. Pertinent negatives include no abdominal pain, chest pain, chills, congestion, coughing, diaphoresis, fatigue, fever, headaches, joint swelling, myalgias, rash, sore throat, urinary symptoms, visual change or weakness. Exacerbated by: palpation. He has tried nothing for the symptoms. The treatment provided no relief.    Past Medical History  Diagnosis Date  . Heroin abuse   . Cocaine abuse   . Narcotic abuse   . Depression   . Anxiety   . Insomnia    Past Surgical History  Procedure Laterality Date  . Wisdom teeth     Family History  Problem Relation Age of Onset  . Alcohol abuse Paternal Uncle   . Drug abuse Paternal Uncle    Social History  Substance Use Topics  . Smoking status: Current Every Day Smoker -- 2.00 packs/day    Types: Cigarettes  . Smokeless tobacco: None  . Alcohol Use: No    Review of Systems  Constitutional: Negative for fever, chills, diaphoresis and fatigue.  HENT: Negative for congestion and sore throat.   Respiratory: Negative for cough.   Cardiovascular: Negative for chest pain.  Gastrointestinal: Negative for abdominal pain.   Genitourinary: Positive for testicular pain.  Musculoskeletal: Negative for myalgias and joint swelling.  Skin: Negative for rash.  Neurological: Negative for weakness and headaches.  All other systems reviewed and are negative.     Allergies  Suboxone  Home Medications   Prior to Admission medications   Medication Sig Start Date End Date Taking? Authorizing Provider  cloNIDine (CATAPRES) 0.1 MG tablet Take 1 tablet (0.1 mg total) by mouth 4 (four) times daily. Tkae 1 tablet as needed for breakthru symptoms 1-3 times daily 1992-03-10 not take if BP 90/60 or below 10/20/14   Adonis Brook, NP  mirtazapine (REMERON SOL-TAB) 15 MG disintegrating tablet Take 1 tablet (15 mg total) by mouth at bedtime. 10/20/14 10/20/15  Adonis Brook, NP  QUEtiapine (SEROQUEL) 50 MG tablet Take one tablet at bed time may repeat 1-2 times 10/20/14   Adonis Brook, NP   BP 137/84 mmHg  Pulse 97  Temp(Src) 98.3 F (36.8 C) (Oral)  Resp 16  SpO2 100% Physical Exam  Constitutional: He appears well-developed and well-nourished. No distress.  HENT:  Head: Normocephalic and atraumatic.  Eyes: Conjunctivae are normal.  Neck: Normal range of motion.  Cardiovascular: Normal rate and regular rhythm.  Exam reveals no gallop and no friction rub.   No murmur heard. Pulmonary/Chest: Effort normal and breath sounds normal. He has no wheezes. He has no rales. He exhibits no tenderness.  Abdominal: Soft. He exhibits no distension. There is no tenderness. There is no rebound.  Genitourinary: Penis normal. No penile tenderness.  Left posterior  testicular tenderness to palpation and swelling of the epididymis.   Musculoskeletal: Normal range of motion.  Neurological: He is alert. Coordination normal.  Speech is goal-oriented. Moves limbs without ataxia.   Skin: Skin is warm and dry.  Psychiatric: He has a normal mood and affect. His behavior is normal.  Nursing note and vitals reviewed.   ED Course  Procedures  (including critical care time) Labs Review Labs Reviewed - No data to display  Imaging Review US Scrotum  10/31/2014   CLINICAL DATA:  Left testicular pain for 3 weeks, which is worsening.  EXAM: ULTRASOUND OF SCROTUM  DOPPLER ULTRASOUND OF THE TESTICLES  TECHNIQUE: Complete ultrasound examination of the testicles, epididymis, and other scrotal structures was performed.  COMPARISON:  None.  FINDINGS: Right testicle  Measurements: 4.2 x by 1.8 x 3.0 cm. No mass or microlithiasis visualized.  Left testicle  Measurements: 4.4 x 2.0 x 2.5 cm. No mass or microlithiasis visualized. Slightly increased intratesticular blood flow.  Right epididymis:  Normal in size and appearance.  Left epididymis: There is prominence of the body and tail of the left epididymis, with increased blood flow.  Hydrocele:  There is a small bilateral hydrocele.  Varicocele:  None visualized.  Pulsed Doppler interrogation of both testes demonstrates normal low resistance arterial and venous waveforms bilaterally.  IMPRESSION: Evidence of left epididymitis and possible mild orchitis.  Normal appearance of the right testicle.  Small bilateral hydrocele.   Electronically Signed   By: Ted Mcalpine M.D.   On: 10/31/2014 17:18   [place] Art/ven Flow Abd Pelv Doppler  10/31/2014   CLINICAL DATA:  Left testicular pain for 3 weeks, which is worsening.  EXAM: ULTRASOUND OF SCROTUM  DOPPLER ULTRASOUND OF THE TESTICLES  TECHNIQUE: Complete ultrasound examination of the testicles, epididymis, and other scrotal structures was performed.  COMPARISON:  None.  FINDINGS: Right testicle  Measurements: 4.2 x by 1.8 x 3.0 cm. No mass or microlithiasis visualized.  Left testicle  Measurements: 4.4 x 2.0 x 2.5 cm. No mass or microlithiasis visualized. Slightly increased intratesticular blood flow.  Right epididymis:  Normal in size and appearance.  Left epididymis: There is prominence of the body and tail of the left epididymis, with increased blood flow.   Hydrocele:  There is a small bilateral hydrocele.  Varicocele:  None visualized.  Pulsed Doppler interrogation of both testes demonstrates normal low resistance arterial and venous waveforms bilaterally.  IMPRESSION: Evidence of left epididymitis and possible mild orchitis.  Normal appearance of the right testicle.  Small bilateral hydrocele.   Electronically Signed   By: Ted Mcalpine M.D.   On: 10/31/2014 17:18   I have personally reviewed and evaluated these images and lab results as part of my medical decision-making.   EKG Interpretation None      MDM   Final diagnoses:  Left testicular pain    8:32 PM Patient's US shows left epididymitis. He denies any fever, chills, NVD. Patient will be treated with  IM rocephin and 10 day course of doxycycline. Vitals stable and patient afebrile.    Emilia Beck, PA-C 10/31/14 2220  Linwood Dibbles, MD 11/02/14 2202

## 2014-10-31 NOTE — ED Notes (Signed)
Left testicular pain for the last 3 weeks and today it is worse. Pt states sharp shooting pain. No redness.

## 2014-11-06 ENCOUNTER — Emergency Department (HOSPITAL_COMMUNITY)
Admission: EM | Admit: 2014-11-06 | Discharge: 2014-11-06 | Disposition: A | Discharge status: Home / Self Care | Payer: BLUE CROSS/BLUE SHIELD | Attending: Emergency Medicine | Admitting: Emergency Medicine

## 2014-11-06 ENCOUNTER — Emergency Department (HOSPITAL_COMMUNITY): Payer: BLUE CROSS/BLUE SHIELD

## 2014-11-06 ENCOUNTER — Encounter (HOSPITAL_COMMUNITY): Payer: Self-pay | Admitting: Emergency Medicine

## 2014-11-06 DIAGNOSIS — G4700 Insomnia, unspecified: Secondary | ICD-10-CM | POA: Diagnosis not present

## 2014-11-06 DIAGNOSIS — M79641 Pain in right hand: Secondary | ICD-10-CM

## 2014-11-06 DIAGNOSIS — X58XXXA Exposure to other specified factors, initial encounter: Secondary | ICD-10-CM | POA: Diagnosis not present

## 2014-11-06 DIAGNOSIS — Y998 Other external cause status: Secondary | ICD-10-CM | POA: Diagnosis not present

## 2014-11-06 DIAGNOSIS — Y9353 Activity, golf: Secondary | ICD-10-CM | POA: Diagnosis not present

## 2014-11-06 DIAGNOSIS — Z87828 Personal history of other (healed) physical injury and trauma: Secondary | ICD-10-CM | POA: Insufficient documentation

## 2014-11-06 DIAGNOSIS — S6991XA Unspecified injury of right wrist, hand and finger(s), initial encounter: Secondary | ICD-10-CM | POA: Diagnosis not present

## 2014-11-06 DIAGNOSIS — Y9239 Other specified sports and athletic area as the place of occurrence of the external cause: Secondary | ICD-10-CM | POA: Insufficient documentation

## 2014-11-06 DIAGNOSIS — Z720 Tobacco use: Secondary | ICD-10-CM | POA: Diagnosis not present

## 2014-11-06 DIAGNOSIS — F419 Anxiety disorder, unspecified: Secondary | ICD-10-CM | POA: Insufficient documentation

## 2014-11-06 DIAGNOSIS — Z792 Long term (current) use of antibiotics: Secondary | ICD-10-CM | POA: Diagnosis not present

## 2014-11-06 DIAGNOSIS — F329 Major depressive disorder, single episode, unspecified: Secondary | ICD-10-CM | POA: Insufficient documentation

## 2014-11-06 MED ORDER — PREDNISONE 50 MG PO TABS
60.0000 mg | ORAL_TABLET | Freq: Once | ORAL | Status: AC
  Administered 2014-11-06: 60 mg via ORAL
  Filled 2014-11-06 (×2): qty 1

## 2014-11-06 MED ORDER — PREDNISONE 10 MG PO TABS: ORAL_TABLET | ORAL | Status: DC
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
## 2014-11-06 NOTE — Discharge Instructions (Signed)
I recommend avoiding any activity that worsens your pain until improved.  Use a heating pad 20 minutes (or warm water bath) 3 times daily.  Take your next dose of prednisone tomorrow.

## 2014-11-06 NOTE — ED Notes (Signed)
Pt c/o of RT hand pain after hitting golf balls yesterday. No edema or deformity noted. Pt able to move all fingers.

## 2014-11-29 ENCOUNTER — Emergency Department (HOSPITAL_COMMUNITY): Payer: BLUE CROSS/BLUE SHIELD

## 2014-11-29 ENCOUNTER — Encounter (HOSPITAL_COMMUNITY): Payer: Self-pay | Admitting: Family Medicine

## 2014-11-29 ENCOUNTER — Emergency Department (HOSPITAL_COMMUNITY)
Admission: EM | Admit: 2014-11-29 | Discharge: 2014-11-29 | Disposition: A | Discharge status: Home / Self Care | Payer: BLUE CROSS/BLUE SHIELD | Attending: Emergency Medicine | Admitting: Emergency Medicine

## 2014-11-29 DIAGNOSIS — G4700 Insomnia, unspecified: Secondary | ICD-10-CM | POA: Diagnosis not present

## 2014-11-29 DIAGNOSIS — R1030 Lower abdominal pain, unspecified: Secondary | ICD-10-CM | POA: Insufficient documentation

## 2014-11-29 DIAGNOSIS — Z720 Tobacco use: Secondary | ICD-10-CM | POA: Diagnosis not present

## 2014-11-29 DIAGNOSIS — N50812 Left testicular pain: Secondary | ICD-10-CM | POA: Diagnosis not present

## 2014-11-29 DIAGNOSIS — F329 Major depressive disorder, single episode, unspecified: Secondary | ICD-10-CM | POA: Insufficient documentation

## 2014-11-29 DIAGNOSIS — Z7952 Long term (current) use of systemic steroids: Secondary | ICD-10-CM | POA: Insufficient documentation

## 2014-11-29 DIAGNOSIS — F419 Anxiety disorder, unspecified: Secondary | ICD-10-CM | POA: Diagnosis not present

## 2014-11-29 LAB — URINALYSIS, ROUTINE W REFLEX MICROSCOPIC
BILIRUBIN URINE: NEGATIVE
Glucose, UA: NEGATIVE mg/dL
HGB URINE DIPSTICK: NEGATIVE
KETONES UR: NEGATIVE mg/dL
NITRITE: NEGATIVE
Protein, ur: NEGATIVE mg/dL
Specific Gravity, Urine: 1.007 (ref 1.005–1.030)
UROBILINOGEN UA: 0.2 mg/dL (ref 0.0–1.0)
pH: 5.5 (ref 5.0–8.0)

## 2014-11-29 LAB — URINE MICROSCOPIC-ADD ON

## 2014-11-29 LAB — COMPREHENSIVE METABOLIC PANEL
ALBUMIN: 4.3 g/dL (ref 3.5–5.0)
ALK PHOS: 76 U/L (ref 38–126)
ALT: 24 U/L (ref 17–63)
ANION GAP: 10 (ref 5–15)
AST: 28 U/L (ref 15–41)
BILIRUBIN TOTAL: 0.4 mg/dL (ref 0.3–1.2)
BUN: 7 mg/dL (ref 6–20)
CALCIUM: 9.7 mg/dL (ref 8.9–10.3)
CO2: 25 mmol/L (ref 22–32)
CREATININE: 0.81 mg/dL (ref 0.61–1.24)
Chloride: 102 mmol/L (ref 101–111)
GFR calc Af Amer: >60 mL/min (ref >60)
GFR calc non Af Amer: >60 mL/min (ref >60)
GLUCOSE: 84 mg/dL (ref 65–99)
Potassium: 3.8 mmol/L (ref 3.5–5.1)
SODIUM: 137 mmol/L (ref 135–145)
TOTAL PROTEIN: 8.6 g/dL — AB (ref 6.5–8.1)

## 2014-11-29 LAB — CBC WITH DIFFERENTIAL/PLATELET
BASOS ABS: 0 10*3/uL (ref 0.0–0.1)
BASOS PCT: 0 %
EOS ABS: 0.1 10*3/uL (ref 0.0–0.7)
EOS PCT: 1 %
HCT: 46.9 % (ref 39.0–52.0)
Hemoglobin: 16.7 g/dL (ref 13.0–17.0)
Lymphocytes Relative: 24 %
Lymphs Abs: 2.7 10*3/uL (ref 0.7–4.0)
MCH: 31.7 pg (ref 26.0–34.0)
MCHC: 35.6 g/dL (ref 30.0–36.0)
MCV: 89.2 fL (ref 78.0–100.0)
MONO ABS: 1 10*3/uL (ref 0.1–1.0)
MONOS PCT: 9 %
Neutro Abs: 7.4 10*3/uL (ref 1.7–7.7)
Neutrophils Relative %: 66 %
PLATELETS: 284 10*3/uL (ref 150–400)
RBC: 5.26 MIL/uL (ref 4.22–5.81)
RDW: 12.5 % (ref 11.5–15.5)
WBC: 11.3 10*3/uL — ABNORMAL HIGH (ref 4.0–10.5)

## 2014-11-29 MED ORDER — CIPROFLOXACIN HCL 500 MG PO TABS
500.0000 mg | ORAL_TABLET | Freq: Once | ORAL | Status: AC
  Administered 2014-11-29: 500 mg via ORAL
  Filled 2014-11-29: qty 1

## 2014-11-29 MED ORDER — IOHEXOL 300 MG/ML  SOLN
100.0000 mL | Freq: Once | INTRAMUSCULAR | Status: AC | PRN
  Administered 2014-11-29: 100 mL via INTRAVENOUS

## 2014-11-29 MED ORDER — IOHEXOL 300 MG/ML  SOLN: 25.0000 mL | Freq: Once | INTRAMUSCULAR | Status: DC | PRN

## 2014-11-29 MED ORDER — KETOROLAC TROMETHAMINE 30 MG/ML IJ SOLN
30.0000 mg | Freq: Once | INTRAMUSCULAR | Status: AC
  Administered 2014-11-29: 30 mg via INTRAVENOUS
  Filled 2014-11-29: qty 1

## 2014-11-29 MED ORDER — NAPROXEN 500 MG PO TABS: 500.0000 mg | ORAL_TABLET | Freq: Two times a day (BID) | ORAL | Status: DC

## 2014-11-29 MED ORDER — CIPROFLOXACIN HCL 500 MG PO TABS: 500.0000 mg | ORAL_TABLET | Freq: Two times a day (BID) | ORAL | Status: DC

## 2014-11-29 NOTE — ED Notes (Signed)
MD at bedside. 

## 2014-11-29 NOTE — Discharge Instructions (Signed)
Epididymitis °Epididymitis is swelling (inflammation) of the epididymis. The epididymis is a cord-like structure that is located along the top and back part of the testicle. It collects and stores sperm from the testicle. °This condition can also cause pain and swelling of the testicle and scrotum. Symptoms usually start suddenly (acute epididymitis). Sometimes epididymitis starts gradually and lasts for a while (chronic epididymitis). This type may be harder to treat. °CAUSES °In men 35 and younger, this condition is usually caused by a bacterial infection or sexually transmitted disease (STD), such as: °· Gonorrhea. °· Chlamydia.   °In men 35 and older who do not have anal sex, this condition is usually caused by bacteria from a blockage or abnormalities in the urinary system. These can result from: °· Having a tube placed into the bladder (urinary catheter). °· Having an enlarged or inflamed prostate gland. °· Having recent urinary tract surgery. °In men who have a condition that weakens the body's defense system (immune system), such as HIV, this condition can be caused by:  °· Other bacteria, including tuberculosis and syphilis. °· Viruses. °· Fungi. °Sometimes this condition occurs without infection. That may happen if urine flows backward into the epididymis after heavy lifting or straining. °RISK FACTORS °This condition is more likely to develop in men: °· Who have unprotected sex with more than one partner. °· Who have anal sex.   °· Who have recently had surgery.   °· Who have a urinary catheter. °· Who have urinary problems. °· Who have a suppressed immune system.   °SYMPTOMS  °This condition usually begins suddenly with chills, fever, and pain behind the scrotum and in the testicle. Other symptoms include:  °· Swelling of the scrotum, testicle, or both. °· Pain when ejaculating or urinating. °· Pain in the back or belly. °· Nausea. °· Itching and discharge from the penis. °· Frequent need to pass  urine. °· Redness and tenderness of the scrotum. °DIAGNOSIS °Your health care provider can diagnose this condition based on your symptoms and medical history. Your health care provider will also do a physical exam to ask about your symptoms and check your scrotum and testicle for swelling, pain, and redness. You may also have other tests, including:   °· Examination of discharge from the penis. °· Urine tests for infections, such as STDs.   °Your health care provider may test you for other STDs, including HIV.  °TREATMENT °Treatment for this condition depends on the cause. If your condition is caused by a bacterial infection, oral antibiotic medicine may be prescribed. If the bacterial infection has spread to your blood, you may need to receive IV antibiotics. Nonbacterial epididymitis is treated with home care that includes bed rest and elevation of the scrotum. °Surgery may be needed to treat: °· Bacterial epididymitis that causes pus to build up in the scrotum (abscess). °· Chronic epididymitis that has not responded to other treatments. °HOME CARE INSTRUCTIONS °Medicines  °· Take over-the-counter and prescription medicines only as told by your health care provider.   °· If you were prescribed an antibiotic medicine, take it as told by your health care provider. Do not stop taking the antibiotic even if your condition improves. °Sexual Activity  °· If your epididymitis was caused by an STD, avoid sexual activity until your treatment is complete. °· Inform your sexual partner or partners if you test positive for an STD. They may need to be treated. Do not engage in sexual activity with your partner or partners until their treatment is completed. °General Instructions  °· Return to your normal activities as told   by your health care provider. Ask your health care provider what activities are safe for you.  Keep your scrotum elevated and supported while resting. Ask your health care provider if you should wear a  scrotal support, such as a jockstrap. Wear it as told by your health care provider.  If directed, apply ice to the affected area:   Put ice in a plastic bag.  Place a towel between your skin and the bag.  Leave the ice on for 20 minutes, 2-3 times per day.  Try taking a sitz bath to help with discomfort. This is a warm water bath that is taken while you are sitting down. The water should only come up to your hips and should cover your buttocks. Do this 3-4 times per day or as told by your health care provider.  Keep all follow-up visits as told by your health care provider. This is important. SEEK MEDICAL CARE IF:   You have a fever.   Your pain medicine is not helping.   Your pain is getting worse.   Your symptoms do not improve within three days.   This information is not intended to replace advice given to you by your health care provider. Make sure you discuss any questions you have with your health care provider.   Document Released: 01/04/2000 Document Revised: 09/27/2014 Document Reviewed: 05/24/2014 Elsevier Interactive Patient Education 2016 [employer]  Ciprofloxacin tablets What is this medicine? CIPROFLOXACIN (sip roe FLOX a sin) is a quinolone antibiotic. It is used to treat certain kinds of bacterial infections. It will not work for colds, flu, or other viral infections. This medicine may be used for other purposes; ask your health care provider or pharmacist if you have questions. What should I tell my health care provider before I take this medicine? They need to know if you have any of these conditions: -bone problems -cerebral disease -history of low levels of potassium in the blood -joint problems -irregular heartbeat -kidney disease -myasthenia gravis -seizures -tendon problems -tingling of the fingers or toes, or other nerve disorder -an unusual or allergic reaction to ciprofloxacin, other antibiotics or medicines, foods, dyes, or  preservatives -pregnant or trying to get pregnant -breast-feeding How should I use this medicine? Take this medicine by mouth with a glass of water. Follow the directions on the prescription label. Take your medicine at regular intervals. Do not take your medicine more often than directed. Take all of your medicine as directed even if you think your are better. Do not skip doses or stop your medicine early. You can take this medicine with food or on an empty stomach. It can be taken with a meal that contains dairy or calcium, but do not take it alone with a dairy product, like milk or yogurt or calcium-fortified juice. A special MedGuide will be given to you by the pharmacist with each prescription and refill. Be sure to read this information carefully each time. Talk to your pediatrician regarding the use of this medicine in children. Special care may be needed. Overdosage: If you think you have taken too much of this medicine contact a poison control center or emergency room at once. NOTE: This medicine is only for you. Do not share this medicine with others. What if I miss a dose? If you miss a dose, take it as soon as you can. If it is almost time for your next dose, take only that dose. Do not take double or extra doses. What may interact  with this medicine? Do not take this medicine with any of the following medications: -cisapride -droperidol -terfenadine -tizanidine This medicine may also interact with the following medications: -antacids -birth control pills -caffeine -cyclosporin -didanosine (ddI) buffered tablets or powder -medicines for diabetes -medicines for inflammation like ibuprofen, naproxen -methotrexate -multivitamins -omeprazole -phenytoin -probenecid -sucralfate -theophylline -warfarin This list may not describe all possible interactions. Give your health care provider a list of all the medicines, herbs, non-prescription drugs, or dietary supplements you use.  Also tell them if you smoke, drink alcohol, or use illegal drugs. Some items may interact with your medicine. What should I watch for while using this medicine? Tell your doctor or health care professional if your symptoms do not improve. Do not treat diarrhea with over the counter products. Contact your doctor if you have diarrhea that lasts more than 2 days or if it is severe and watery. You may get drowsy or dizzy. Do not drive, use machinery, or do anything that needs mental alertness until you know how this medicine affects you. Do not stand or sit up quickly, especially if you are an older patient. This reduces the risk of dizzy or fainting spells. This medicine can make you more sensitive to the sun. Keep out of the sun. If you cannot avoid being in the sun, wear protective clothing and use sunscreen. Do not use sun lamps or tanning beds/booths. Avoid antacids, aluminum, calcium, iron, magnesium, and zinc products for 6 hours before and 2 hours after taking a dose of this medicine. What side effects may I notice from receiving this medicine? Side effects that you should report to your doctor or health care professional as soon as possible: -allergic reactions like skin rash or hives, swelling of the face, lips, or tongue -anxious -confusion -depressed mood -diarrhea -fast, irregular heartbeat -hallucination, loss of contact with reality -joint, muscle, or tendon pain or swelling -pain, tingling, numbness in the hands or feet -suicidal thoughts or other mood changes -sunburn -unusually weak or tired Side effects that usually do not require medical attention (report to your doctor or health care professional if they continue or are bothersome): -dry mouth -headache -nausea -trouble sleeping This list may not describe all possible side effects. Call your doctor for medical advice about side effects. You may report side effects to FDA at 1-800-FDA-1088. Where should I keep my  medicine? Keep out of the reach of children. Store at room temperature below 30 degrees C (86 degrees F). Keep container tightly closed. Throw away any unused medicine after the expiration date. NOTE: This sheet is a summary. It may not cover all possible information. If you have questions about this medicine, talk to your doctor, pharmacist, or health care provider.    2016, Elsevier/Gold Standard. (2014-08-17 12:57:02)  Naproxen and naproxen sodium oral immediate-release tablets What is this medicine? NAPROXEN (na PROX en) is a non-steroidal anti-inflammatory drug (NSAID). It is used to reduce swelling and to treat pain. This medicine may be used for dental pain, headache, or painful monthly periods. It is also used for painful joint and muscular problems such as arthritis, tendinitis, bursitis, and gout. This medicine may be used for other purposes; ask your health care provider or pharmacist if you have questions. What should I tell my health care provider before I take this medicine? They need to know if you have any of these conditions: -asthma -cigarette smoker -drink more than 3 alcohol containing drinks a day -heart disease or circulation problems such as heart  failure or leg edema (fluid retention) -high blood pressure -kidney disease -liver disease -stomach bleeding or ulcers -an unusual or allergic reaction to naproxen, aspirin, other NSAIDs, other medicines, foods, dyes, or preservatives -pregnant or trying to get pregnant -breast-feeding How should I use this medicine? Take this medicine by mouth with a glass of water. Follow the directions on the prescription label. Take it with food if your stomach gets upset. Try to not lie down for at least 10 minutes after you take it. Take your medicine at regular intervals. Do not take your medicine more often than directed. Long-term, continuous use may increase the risk of heart attack or stroke. A special MedGuide will be given to  you by the pharmacist with each prescription and refill. Be sure to read this information carefully each time. Talk to your pediatrician regarding the use of this medicine in children. Special care may be needed. Overdosage: If you think you have taken too much of this medicine contact a poison control center or emergency room at once. NOTE: This medicine is only for you. Do not share this medicine with others. What if I miss a dose? If you miss a dose, take it as soon as you can. If it is almost time for your next dose, take only that dose. Do not take double or extra doses. What may interact with this medicine? -alcohol -aspirin -cidofovir -diuretics -lithium -methotrexate -other drugs for inflammation like ketorolac or prednisone -pemetrexed -probenecid -warfarin This list may not describe all possible interactions. Give your health care provider a list of all the medicines, herbs, non-prescription drugs, or dietary supplements you use. Also tell them if you smoke, drink alcohol, or use illegal drugs. Some items may interact with your medicine. What should I watch for while using this medicine? Tell your doctor or health care professional if your pain does not get better. Talk to your doctor before taking another medicine for pain. Do not treat yourself. This medicine does not prevent heart attack or stroke. In fact, this medicine may increase the chance of a heart attack or stroke. The chance may increase with longer use of this medicine and in people who have heart disease. If you take aspirin to prevent heart attack or stroke, talk with your doctor or health care professional. Do not take other medicines that contain aspirin, ibuprofen, or naproxen with this medicine. Side effects such as stomach upset, nausea, or ulcers may be more likely to occur. Many medicines available without a prescription should not be taken with this medicine. This medicine can cause ulcers and bleeding in the  stomach and intestines at any time during treatment. Do not smoke cigarettes or drink alcohol. These increase irritation to your stomach and can make it more susceptible to damage from this medicine. Ulcers and bleeding can happen without warning symptoms and can cause death. You may get drowsy or dizzy. Do not drive, use machinery, or do anything that needs mental alertness until you know how this medicine affects you. Do not stand or sit up quickly, especially if you are an older patient. This reduces the risk of dizzy or fainting spells. This medicine can cause you to bleed more easily. Try to avoid damage to your teeth and gums when you brush or floss your teeth. What side effects may I notice from receiving this medicine? Side effects that you should report to your doctor or health care professional as soon as possible: -black or bloody stools, blood in the urine or  vomit -blurred vision -chest pain -difficulty breathing or wheezing -nausea or vomiting -severe stomach pain -skin rash, skin redness, blistering or peeling skin, hives, or itching -slurred speech or weakness on one side of the body -swelling of eyelids, throat, lips -unexplained weight gain or swelling -unusually weak or tired -yellowing of eyes or skin Side effects that usually do not require medical attention (report to your doctor or health care professional if they continue or are bothersome): -constipation -headache -heartburn This list may not describe all possible side effects. Call your doctor for medical advice about side effects. You may report side effects to FDA at 1-800-FDA-1088. Where should I keep my medicine? Keep out of the reach of children. Store at room temperature between 15 and 30 degrees C (59 and 86 degrees F). Keep container tightly closed. Throw away any unused medicine after the expiration date. NOTE: This sheet is a summary. It may not cover all possible information. If you have questions about  this medicine, talk to your doctor, pharmacist, or health care provider.    2016, Elsevier/[employer] Standard. (2009-01-08 20:10:16)

## 2014-11-29 NOTE — ED Provider Notes (Signed)
CSN: [id number]     Arrival date & time 11/29/14  1446 History   First MD Initiated Contact with Patient 11/29/14 1621     Chief Complaint  Patient presents with  . Groin Pain     (Consider location/radiation/quality/duration/timing/severity/associated sxs/prior Treatment) Patient is a 24 y.o. male presenting with groin pain. The history is provided by the patient.  Groin Pain  He was seen here one month ago for testicular pain and diagnosed with epididymitis and given a 10 day course of doxycycline. He states that pain never improved and he continues to have pain in his left testicle. He rates pain at 8/10. Pain is worse with sitting and walking. Nothing makes it better. He denies fever, chills, sweats. There's been no nausea, vomiting, diarrhea. He states he has been urinating more often than normal. Pain is radiating up into his lower abdomen. He states the pain is actually worse since taking the antibiotics. Testicles not swollen. He denies any trauma. Of note, he is going through detox off of heroin. He states no heroin use in the last 2-1/2 weeks.  Past Medical History  Diagnosis Date  . Heroin abuse   . Cocaine abuse   . Narcotic abuse   . Depression   . Anxiety   . Insomnia    Past Surgical History  Procedure Laterality Date  . Wisdom teeth     Family History  Problem Relation Age of Onset  . Alcohol abuse Paternal Uncle   . Drug abuse Paternal Uncle    Social History  Substance Use Topics  . Smoking status: Current Every Day Smoker -- 2.00 packs/day    Types: Cigarettes  . Smokeless tobacco: None  . Alcohol Use: No    Review of Systems  All other systems reviewed and are negative.     Allergies  Suboxone  Home Medications   Prior to Admission medications   Medication Sig Start Date End Date Taking? Authorizing Provider  cloNIDine (CATAPRES) 0.1 MG tablet Take 1 tablet (0.1 mg total) by mouth 4 (four) times daily. Tkae 1 tablet as needed for breakthru  symptoms 1-3 times daily 1992[birth year]11[birth year] not take if BP 90/60 or below Patient not taking: Reported on 10/31/2014 10/20/14   Adonis Brook, NP  doxycycline (VIBRAMYCIN) 100 MG capsule Take 1 capsule (100 mg total) by mouth 2 (two) times daily. 10/31/14   Kaitlyn Szekalski, PA-C  mirtazapine (REMERON SOL-TAB) 15 MG disintegrating tablet Take 1 tablet (15 mg total) by mouth at bedtime. Patient not taking: Reported on 10/31/2014 10/20/14 10/20/15  Adonis Brook, NP  predniSONE (DELTASONE) 10 MG tablet 6, 5, 4, 3, 2 then 1 tablet by mouth daily for 6 days total. 11/06/14   Burgess Amor, PA-C  QUEtiapine (SEROQUEL) 50 MG tablet Take one tablet at bed time may repeat 1-2 times Patient not taking: Reported on 10/31/2014 10/20/14   Adonis Brook, NP   BP 129/76 mmHg  Pulse 93  Temp(Src) 98.4 F (36.9 C) (Oral)  Resp 18  SpO2 98% Physical Exam  Nursing note and vitals reviewed.  23 year old male, resting comfortably and in no acute distress. Vital signs are normal. Oxygen saturation is 98%, which is normal. Head is normocephalic and atraumatic. PERRLA, EOMI. Oropharynx is clear. Neck is nontender and supple without adenopathy or JVD. Back is nontender and there is no CVA tenderness. Lungs are clear without rales, wheezes, or rhonchi. Chest is nontender. Heart has regular rate and rhythm without murmur. Abdomen is soft, flat, nontender without  masses or hepatosplenomegaly and peristalsis is normoactive. Genitalia: Circumcised penis. Testes descended without masses. Mild tenderness of the left testicle but no induration. No scrotal masses or inguinal hernias. No inguinal adenopathy. Extremities have no cyanosis or edema, full range of motion is present. Skin is warm and dry without rash. Neurologic: Mental status is normal, cranial nerves are intact, there are no motor or sensory deficits.  ED Course  Procedures (including critical care time) Labs Review Results for orders placed or performed during  the hospital encounter of 11/29/14  Comprehensive metabolic panel  Result Value Ref Range   Sodium 137 135 - 145 mmol/L   Potassium 3.8 3.5 - 5.1 mmol/L   Chloride 102 101 - 111 mmol/L   CO2 25 22 - 32 mmol/L   Glucose, Bld 84 65 - 99 mg/dL   BUN 7 6 - 20 mg/dL   Creatinine, Ser 0.980.81 0.61 - 1.24 mg/dL   Calcium 9.7 8.9 - 11.910.3 mg/dL   Total Protein 8.6 (H) 6.5 - 8.1 g/dL   Albumin 4.3 3.5 - 5.0 g/dL   AST 28 15 - 41 U/L   ALT 24 17 - 63 U/L   Alkaline Phosphatase 76 38 - 126 U/L   Total Bilirubin 0.4 0.3 - 1.2 mg/dL   GFR calc non Af Amer >60 >60 mL/min   GFR calc Af Amer >60 >60 mL/min   Anion gap 10 5 - 15  CBC with Differential  Result Value Ref Range   WBC 11.3 (H) 4.0 - 10.5 K/uL   RBC 5.26 4.22 - 5.81 MIL/uL   Hemoglobin 16.7 13.0 - 17.0 g/dL   HCT 14.746.9 82.939.0 - 56.252.0 %   MCV 89.2 78.0 - 100.0 fL   MCH 31.7 26.0 - 34.0 pg   MCHC 35.6 30.0 - 36.0 g/dL   RDW 13.012.5 86.511.5 - 78.415.5 %   Platelets 284 150 - 400 K/uL   Neutrophils Relative % 66 %   Neutro Abs 7.4 1.7 - 7.7 K/uL   Lymphocytes Relative 24 %   Lymphs Abs 2.7 0.7 - 4.0 K/uL   Monocytes Relative 9 %   Monocytes Absolute 1.0 0.1 - 1.0 K/uL   Eosinophils Relative 1 %   Eosinophils Absolute 0.1 0.0 - 0.7 K/uL   Basophils Relative 0 %   Basophils Absolute 0.0 0.0 - 0.1 K/uL  Urinalysis, Routine w reflex microscopic  Result Value Ref Range   Color, Urine YELLOW YELLOW   APPearance CLEAR CLEAR   Specific Gravity, Urine 1.007 1.005 - 1.030   pH 5.5 5.0 - 8.0   Glucose, UA NEGATIVE NEGATIVE mg/dL   Hgb urine dipstick NEGATIVE NEGATIVE   Bilirubin Urine NEGATIVE NEGATIVE   Ketones, ur NEGATIVE NEGATIVE mg/dL   Protein, ur NEGATIVE NEGATIVE mg/dL   Urobilinogen, UA 0.2 0.0 - 1.0 mg/dL   Nitrite NEGATIVE NEGATIVE   Leukocytes, UA MODERATE (A) NEGATIVE  Urine microscopic-add on  Result Value Ref Range   Squamous Epithelial / LPF RARE RARE   WBC, UA 7-10 <3 WBC/hpf   RBC / HPF 0-2 <3 RBC/hpf   Bacteria, UA RARE RARE     Imaging Review Ct Abdomen Pelvis W Contrast  11/29/2014  CLINICAL DATA:  Left scrotal region pain for several months, progressively worsening. Pain radiating up left side of the abdomen since last night. EXAM: CT ABDOMEN AND PELVIS WITH CONTRAST TECHNIQUE: Multidetector CT imaging of the abdomen and pelvis was performed using the standard protocol following bolus administration of intravenous contrast. CONTRAST:  OMNIPAQUE IOHEXOL 300 MG/ML  SOLN COMPARISON:  CT, 06/15/2012 FINDINGS: Lung bases:  Clear.  Heart normal size. Liver, spleen, gallbladder, pancreas, adrenal glands:  Normal. Kidneys, ureters, bladder:  Normal. Lymph nodes:  No adenopathy. Ascites:  None. Gastrointestinal:  Normal.  Normal appendix visualized. Reproductive: Unremarkable. Musculoskeletal:  Normal. IMPRESSION: Normal enhanced CT scan of the abdomen pelvis. Electronically Signed   By: Amie Portland M.D.   On: 11/29/2014 17:36   I have personally reviewed and evaluated these images and lab results as part of my medical decision-making.   MDM   Final diagnoses:  Pain in left testicle    Left lower quadrant and left testicular pain of uncertain cause. Old records reviewed confirming ED visit on October 11 with ultrasound showing evidence of epididymitis. He received a 10 day course of doxycycline as well as a dose of ceftriaxone. With pain now going into his abdomen, will check CT of abdomen and pelvis to look for other pathology.  CT is unremarkable. Urinalysis does have 7-10 WBCs. It is possible that he had epididymitis 22 some numbing other than chlamydia. Urine is been sent for culture. He is discharged with prescription for ciprofloxacin and naproxen and he is referred to urology for follow-up.  Dione Booze, MD 11/29/14 [phone]

## 2014-11-29 NOTE — ED Notes (Signed)
Pt here for left testicle pain radiating into LUQ. sts he has taken steroids and 10 days abx and not better.

## 2014-12-01 LAB — URINE CULTURE: CULTURE: NO GROWTH

## 2014-12-05 NOTE — Progress Notes (Deleted)
    Daily Group Progress Note  Program: CD-IOP   Group Time: 1-2:45  Participation Level: {CHL AMB BH Group Participation:21022742}  Behavioral Response: {CHL AMB BH Group Behavior:21022743}  Type of Therapy: {CHL AMB BH Type of Therapy:21022741}  Topic: ***     Group Time: ***  Participation Level: {CHL AMB BH Group Participation:21022742}  Behavioral Response: {CHL AMB BH Group Behavior:21022743}  Type of Therapy: {CHL AMB BH Type of Therapy:21022741}  Topic: ***   Summary: ***   Family Program: Family present? {BHH YES OR NO:22294}   Name of family member(s): ***  UDS collected: {BHH YES OR NO:22294} Results: {Findings; urine drug screen:60936}  AA/NA attended?: {BHH YES OR NO:22294}{DAYS OF LKGM:01027}WEEK:22385}  Sponsor?: {BHH YES OR NO:22294}   Keyaan Lederman S, Licensed Cli

## 2014-12-12 ENCOUNTER — Ambulatory Visit (INDEPENDENT_AMBULATORY_CARE_PROVIDER_SITE_OTHER): Payer: BLUE CROSS/BLUE SHIELD | Admitting: Urology

## 2014-12-12 DIAGNOSIS — N509 Disorder of male genital organs, unspecified: Secondary | ICD-10-CM | POA: Diagnosis not present

## 2014-12-12 DIAGNOSIS — N50812 Left testicular pain: Secondary | ICD-10-CM

## 2015-02-21 ENCOUNTER — Emergency Department (HOSPITAL_COMMUNITY)
Admission: EM | Admit: 2015-02-21 | Discharge: 2015-02-21 | Discharge status: Against Medical Advice | Payer: BLUE CROSS/BLUE SHIELD | Attending: Emergency Medicine | Admitting: Emergency Medicine

## 2015-02-21 ENCOUNTER — Encounter (HOSPITAL_COMMUNITY): Payer: Self-pay

## 2015-02-21 DIAGNOSIS — T401X1A Poisoning by heroin, accidental (unintentional), initial encounter: Secondary | ICD-10-CM | POA: Diagnosis not present

## 2015-02-21 DIAGNOSIS — Y9389 Activity, other specified: Secondary | ICD-10-CM | POA: Diagnosis not present

## 2015-02-21 DIAGNOSIS — Z8669 Personal history of other diseases of the nervous system and sense organs: Secondary | ICD-10-CM | POA: Insufficient documentation

## 2015-02-21 DIAGNOSIS — Y9289 Other specified places as the place of occurrence of the external cause: Secondary | ICD-10-CM | POA: Diagnosis not present

## 2015-02-21 DIAGNOSIS — F17210 Nicotine dependence, cigarettes, uncomplicated: Secondary | ICD-10-CM | POA: Insufficient documentation

## 2015-02-21 DIAGNOSIS — Y998 Other external cause status: Secondary | ICD-10-CM | POA: Insufficient documentation

## 2015-02-21 DIAGNOSIS — Z791 Long term (current) use of non-steroidal anti-inflammatories (NSAID): Secondary | ICD-10-CM | POA: Diagnosis not present

## 2015-02-21 DIAGNOSIS — Z792 Long term (current) use of antibiotics: Secondary | ICD-10-CM | POA: Insufficient documentation

## 2015-02-21 DIAGNOSIS — Z8659 Personal history of other mental and behavioral disorders: Secondary | ICD-10-CM | POA: Diagnosis not present

## 2015-02-21 MED ORDER — ACETAMINOPHEN 500 MG PO TABS
1000.0000 mg | ORAL_TABLET | Freq: Once | ORAL | Status: DC
  Filled 2015-02-21: qty 2

## 2015-02-21 NOTE — ED Notes (Signed)
Pt remains in the waiting area trying to find a ride home.  Pt has been ambulatory with steady gait, clear speech, alert, oriented, nad

## 2015-02-21 NOTE — ED Notes (Signed)
Pt is ambulatory without diff, states he is leaving but does not have a ride home.  Pt is speaking clearly and appears able to care for self.  Dr Hyacinth Meeker made aware and is in agreement that patient has been observed for a period of time to deem him safe to leave at this time.
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
## 2015-02-21 NOTE — ED Notes (Signed)
Pt refusing treatment and demanding to leave.  Removed IV, had pt sign ama form and informed Dr. Hyacinth Meeker.  Dr. Hyacinth Meeker talked with pt and states that he must be observe for at least 1 hour.  Pt sitting in bed
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
## 2015-02-22 ENCOUNTER — Encounter (HOSPITAL_COMMUNITY): Payer: Self-pay | Admitting: Emergency Medicine

## 2015-02-22 ENCOUNTER — Emergency Department (HOSPITAL_COMMUNITY)
Admission: EM | Admit: 2015-02-22 | Discharge: 2015-02-23 | Payer: BLUE CROSS/BLUE SHIELD | Attending: Emergency Medicine | Admitting: Emergency Medicine

## 2015-02-22 ENCOUNTER — Emergency Department (HOSPITAL_COMMUNITY)
Admission: EM | Admit: 2015-02-22 | Discharge: 2015-02-22 | Disposition: A | Discharge status: Home / Self Care | Payer: BLUE CROSS/BLUE SHIELD | Source: Home / Self Care | Attending: Emergency Medicine | Admitting: Emergency Medicine

## 2015-02-22 DIAGNOSIS — Z046 Encounter for general psychiatric examination, requested by authority: Secondary | ICD-10-CM | POA: Diagnosis present

## 2015-02-22 DIAGNOSIS — Z8669 Personal history of other diseases of the nervous system and sense organs: Secondary | ICD-10-CM | POA: Insufficient documentation

## 2015-02-22 DIAGNOSIS — R000 Tachycardia, unspecified: Secondary | ICD-10-CM

## 2015-02-22 DIAGNOSIS — F17210 Nicotine dependence, cigarettes, uncomplicated: Secondary | ICD-10-CM

## 2015-02-22 DIAGNOSIS — Z7289 Other problems related to lifestyle: Secondary | ICD-10-CM

## 2015-02-22 DIAGNOSIS — F1910 Other psychoactive substance abuse, uncomplicated: Secondary | ICD-10-CM

## 2015-02-22 DIAGNOSIS — F1110 Opioid abuse, uncomplicated: Secondary | ICD-10-CM | POA: Insufficient documentation

## 2015-02-22 DIAGNOSIS — F1210 Cannabis abuse, uncomplicated: Secondary | ICD-10-CM | POA: Diagnosis not present

## 2015-02-22 DIAGNOSIS — R45851 Suicidal ideations: Secondary | ICD-10-CM | POA: Insufficient documentation

## 2015-02-22 DIAGNOSIS — R4182 Altered mental status, unspecified: Secondary | ICD-10-CM | POA: Diagnosis not present

## 2015-02-22 DIAGNOSIS — Z792 Long term (current) use of antibiotics: Secondary | ICD-10-CM | POA: Insufficient documentation

## 2015-02-22 DIAGNOSIS — F1310 Sedative, hypnotic or anxiolytic abuse, uncomplicated: Secondary | ICD-10-CM | POA: Diagnosis not present

## 2015-02-22 DIAGNOSIS — Z791 Long term (current) use of non-steroidal anti-inflammatories (NSAID): Secondary | ICD-10-CM | POA: Insufficient documentation

## 2015-02-22 DIAGNOSIS — F1510 Other stimulant abuse, uncomplicated: Secondary | ICD-10-CM | POA: Diagnosis not present

## 2015-02-22 LAB — COMPREHENSIVE METABOLIC PANEL
ALT: 32 U/L (ref 17–63)
ANION GAP: 10 (ref 5–15)
AST: 27 U/L (ref 15–41)
Albumin: 4.4 g/dL (ref 3.5–5.0)
Alkaline Phosphatase: 69 U/L (ref 38–126)
BUN: 13 mg/dL (ref 6–20)
CALCIUM: 9.1 mg/dL (ref 8.9–10.3)
CHLORIDE: 105 mmol/L (ref 101–111)
CO2: 26 mmol/L (ref 22–32)
Creatinine, Ser: 0.9 mg/dL (ref 0.61–1.24)
GFR calc non Af Amer: >60 mL/min (ref >60)
Glucose, Bld: 107 mg/dL — ABNORMAL HIGH (ref 65–99)
POTASSIUM: 3.4 mmol/L — AB (ref 3.5–5.1)
SODIUM: 141 mmol/L (ref 135–145)
Total Bilirubin: 0.5 mg/dL (ref 0.3–1.2)
Total Protein: 8.5 g/dL — ABNORMAL HIGH (ref 6.5–8.1)

## 2015-02-22 LAB — CBC
HCT: 43.2 % (ref 39.0–52.0)
HEMOGLOBIN: 14.7 g/dL (ref 13.0–17.0)
MCH: 31.1 pg (ref 26.0–34.0)
MCHC: 34 g/dL (ref 30.0–36.0)
MCV: 91.5 fL (ref 78.0–100.0)
Platelets: 278 10*3/uL (ref 150–400)
RBC: 4.72 MIL/uL (ref 4.22–5.81)
RDW: 12.2 % (ref 11.5–15.5)
WBC: 14.9 10*3/uL — AB (ref 4.0–10.5)

## 2015-02-22 LAB — RAPID URINE DRUG SCREEN, HOSP PERFORMED
Amphetamines: POSITIVE — AB
Barbiturates: NOT DETECTED
Benzodiazepines: POSITIVE — AB
COCAINE: NOT DETECTED
OPIATES: POSITIVE — AB
TETRAHYDROCANNABINOL: POSITIVE — AB

## 2015-02-22 LAB — ETHANOL: Alcohol, Ethyl (B): <5 mg/dL (ref <5)

## 2015-02-22 LAB — ACETAMINOPHEN LEVEL

## 2015-02-22 LAB — SALICYLATE LEVEL

## 2015-02-22 MED ORDER — ADULT MULTIVITAMIN W/MINERALS CH
1.0000 | ORAL_TABLET | Freq: Every day | ORAL | Status: DC
  Administered 2015-02-22 – 2015-02-23 (×2): 1 via ORAL
  Filled 2015-02-22 (×2): qty 1

## 2015-02-22 MED ORDER — THIAMINE HCL 100 MG/ML IJ SOLN
100.0000 mg | Freq: Every day | INTRAMUSCULAR | Status: DC
Start: 2015-02-22 — End: 2015-02-23

## 2015-02-22 MED ORDER — METHOCARBAMOL 500 MG PO TABS
500.0000 mg | ORAL_TABLET | Freq: Three times a day (TID) | ORAL | Status: DC | PRN
  Administered 2015-02-22: 500 mg via ORAL
  Filled 2015-02-22: qty 1

## 2015-02-22 MED ORDER — ONDANSETRON 4 MG PO TBDP
4.0000 mg | ORAL_TABLET | Freq: Four times a day (QID) | ORAL | Status: DC | PRN
  Administered 2015-02-22: 4 mg via ORAL
  Filled 2015-02-22: qty 1

## 2015-02-22 MED ORDER — FOLIC ACID 1 MG PO TABS
1.0000 mg | ORAL_TABLET | Freq: Every day | ORAL | Status: DC
  Administered 2015-02-22 – 2015-02-23 (×2): 1 mg via ORAL
  Filled 2015-02-22 (×2): qty 1

## 2015-02-22 MED ORDER — DICYCLOMINE HCL 20 MG PO TABS
20.0000 mg | ORAL_TABLET | Freq: Four times a day (QID) | ORAL | Status: DC | PRN
  Administered 2015-02-22: 20 mg via ORAL
  Filled 2015-02-22: qty 1

## 2015-02-22 MED ORDER — QUETIAPINE FUMARATE 100 MG PO TABS
100.0000 mg | ORAL_TABLET | Freq: Every evening | ORAL | Status: DC | PRN
  Administered 2015-02-22: 100 mg via ORAL
  Filled 2015-02-22: qty 1

## 2015-02-22 MED ORDER — LORAZEPAM 1 MG PO TABS: 1.0000 mg | ORAL_TABLET | Freq: Four times a day (QID) | ORAL | Status: DC | PRN

## 2015-02-22 MED ORDER — LORAZEPAM 1 MG PO TABS
0.0000 mg | ORAL_TABLET | Freq: Four times a day (QID) | ORAL | Status: DC
  Administered 2015-02-22 – 2015-02-23 (×2): 2 mg via ORAL
  Filled 2015-02-22 (×2): qty 2

## 2015-02-22 MED ORDER — VITAMIN B-1 100 MG PO TABS
100.0000 mg | ORAL_TABLET | Freq: Every day | ORAL | Status: DC
  Administered 2015-02-22 – 2015-02-23 (×2): 100 mg via ORAL
  Filled 2015-02-22 (×2): qty 1

## 2015-02-22 MED ORDER — HYDROXYZINE HCL 25 MG PO TABS: 25.0000 mg | ORAL_TABLET | Freq: Four times a day (QID) | ORAL | Status: DC | PRN

## 2015-02-22 MED ORDER — LORAZEPAM 2 MG/ML IJ SOLN: 1.0000 mg | Freq: Four times a day (QID) | INTRAMUSCULAR | Status: DC | PRN

## 2015-02-22 MED ORDER — NAPROXEN 500 MG PO TABS: 500.0000 mg | ORAL_TABLET | Freq: Two times a day (BID) | ORAL | Status: DC | PRN

## 2015-02-22 MED ORDER — LOPERAMIDE HCL 2 MG PO CAPS: 2.0000 mg | ORAL_CAPSULE | ORAL | Status: DC | PRN

## 2015-02-22 MED ORDER — LORAZEPAM 1 MG PO TABS: 0.0000 mg | ORAL_TABLET | Freq: Two times a day (BID) | ORAL | Status: DC
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
## 2015-02-22 NOTE — ED Notes (Signed)
Patient arrived on the unit from Triage.  He was escorted back by [employer] and taken to his room.  I introduced myself to him and he immediately became angry and demanded to leave.  I explained the IVC process to him and he kept telling me it was not legal to detain him.  Julieanne Cotton came and spoke to him and told him basically the same thing I had.  He then stated "I don't give a fuck about you or you," indicating myself and the NP.  He stated he was going to find a way to get out of here tonight.  He was advised that the police would be notified immediately if he left.  He is currently in his room with the lights off and the door closed.

## 2015-02-22 NOTE — Progress Notes (Deleted)
Patient aware he would be going to [facility]. In stable condition, B/P low, and has been on the low side. [place] given. [employer] arrived and transported patient to [facility] with his belongings.

## 2015-02-22 NOTE — ED Notes (Signed)
Patient brought IVCed by police for suicidal ideation. Pt adamantly denies SI/HI/AVH. Pt overdosed on heroine last night. Patient brought by the police this morning for the same reason because his mother was in the process of filing IVC papers but mother changed her mind, did not file papers, and patient left ED.

## 2015-02-22 NOTE — Progress Notes (Signed)
Patient appeared very agitated, irritable and demanding at the beginning of this shift. He constantly requesting to be discharged. Patient stated that staff has right to hold him here. He said the doctor signed his committment paper and told him he can leave. Writer explained to patient that the paper in his room is IVC paper and that he is involuntarily committed. Patient appeared to be withdrawing from opiate. Writer called the emergency department Physician and he place patient on opiate withdrawal protocol. Patient requested for Seroquel to help him sleep. Patient received all the medications needed for his symptoms. He seemed calm after that. Q 15 minute check continues for safety.

## 2015-02-22 NOTE — ED Notes (Signed)
Bed: [facility]42 Expected date:  Expected time:  Means of arrival:  Comments: Tr 4  

## 2015-02-22 NOTE — ED Notes (Signed)
Pt brought in handcuffs by police, police state mother of patient is in process of filling IVC papers on pt for suicidal ideation. Pt adamantly denies SI/HI/AVH. No history of suicide attempts. Pt overdosed on heroine last night.

## 2015-02-22 NOTE — ED Notes (Signed)
Patient left prior to receiving discharge papers, discharge vitals, or providing discharge signature. No apparent distress.
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
## 2015-02-23 MED ORDER — ZIPRASIDONE MESYLATE 20 MG IM SOLR
10.0000 mg | Freq: Once | INTRAMUSCULAR | Status: AC | PRN
  Administered 2015-02-23: 10 mg via INTRAMUSCULAR
  Filled 2015-02-23: qty 20

## 2015-02-23 MED ORDER — STERILE WATER FOR INJECTION IJ SOLN
INTRAMUSCULAR | Status: AC
  Administered 2015-02-23: 10 mL
  Filled 2015-02-23: qty 10

## 2015-02-23 MED ORDER — LORAZEPAM 2 MG/ML IJ SOLN
2.0000 mg | Freq: Once | INTRAMUSCULAR | Status: AC | PRN
  Administered 2015-02-23: 2 mg via INTRAMUSCULAR
  Filled 2015-02-23: qty 1

## 2015-02-23 MED ORDER — QUETIAPINE FUMARATE 100 MG PO TABS: 100.0000 mg | ORAL_TABLET | Freq: Every evening | ORAL | Status: DC | PRN

## 2015-02-23 NOTE — ED Provider Notes (Signed)
Patient presenting under IVC, and meeting inpatient criteria.  Accepted at [facility] by Dr. Shawnie Dapper Vital signs stable. Patient is very agitated on my evaluation, stating he wants to go home. States he cannot be held in ED against his will and demanding to be discharged. Discussed IVC process and psych recommendation. Continued to be become very agitated and attempted to leave the BHU, requiring security to escort him back to room. Continues to be verbally aggressive and agitated, kicks BHU door. Attempted to explain multiple times pysch evaluation process, but patient continues to be uncooperative. Given IM ativan and geodon.   Lavera Guise, MD 02/23/15 [phone]

## 2015-02-23 NOTE — BH Assessment (Signed)
[facility] Assessment Progress Note   Bjorn Loser at [facility] said that patient had been accepted to Dr. Merideth Abbey.  Nurse call report number is [phone]. Pt can come after 07:00 on 02/03

## 2015-02-23 NOTE — Progress Notes (Signed)
Patient resting quietly with eyes closed. Respirations even and unlabored. No distress noted.

## 2015-02-23 NOTE — ED Notes (Signed)
Patient woke up use the bathroom and returned to his room.

## 2017-01-05 IMAGING — US US SCROTUM
1 series · 14 of 25 positions shown · non-contrast
Comparison: None.

CLINICAL DATA: Left testicular pain for 3 weeks, which is
worsening.

EXAM:
ULTRASOUND OF SCROTUM
DOPPLER ULTRASOUND OF THE TESTICLES
TECHNIQUE: Complete ultrasound examination of the testicles, epididymis, and
other scrotal structures was performed.

[Series 1: us scrotum · 0.07mm/px · 14 of 49 slices shown]
[im 1/49]
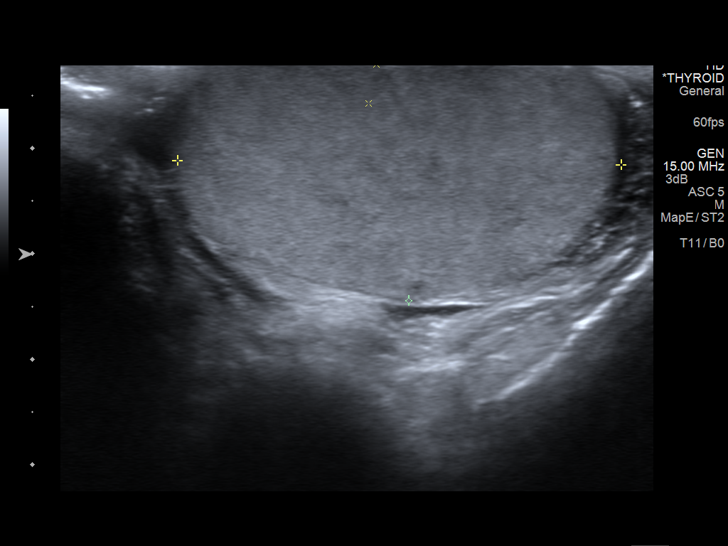
[im 5/49]
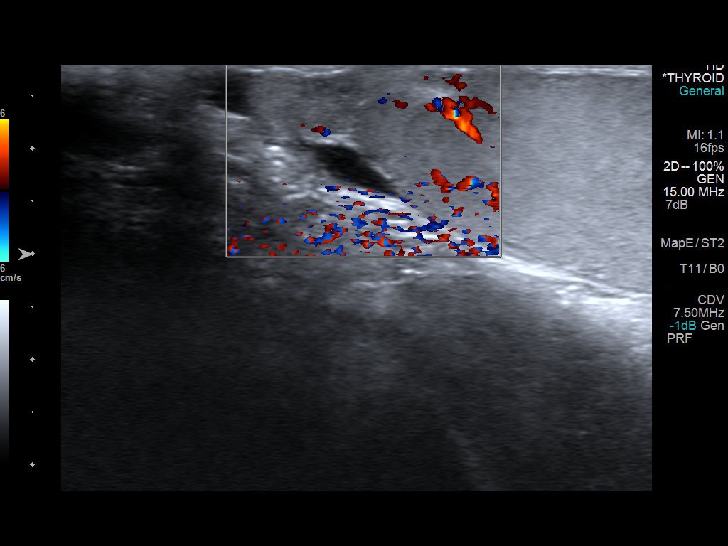
[im 9/49]
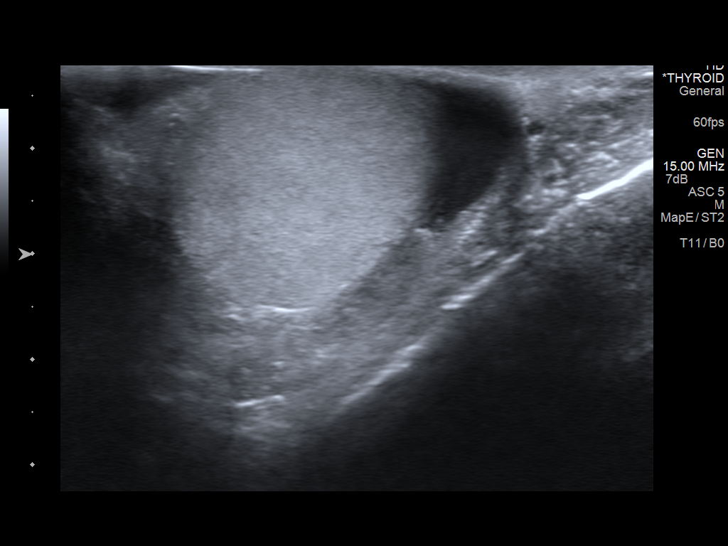
[im 13/49]
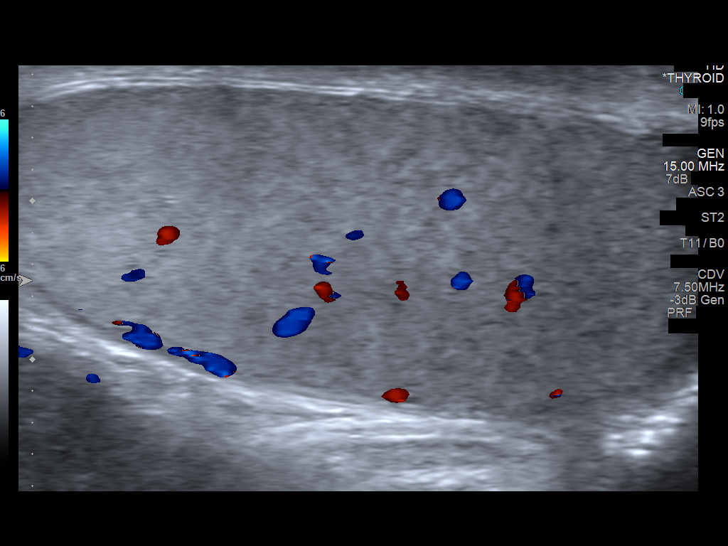
[im 17/49]
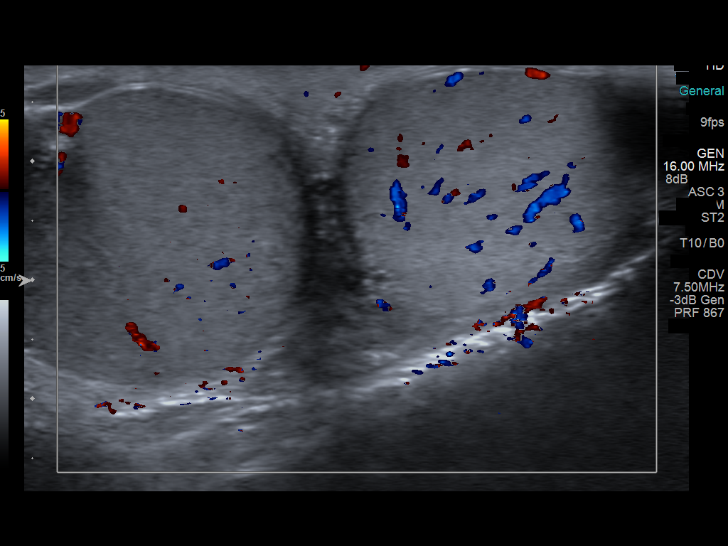
[im 19/49]
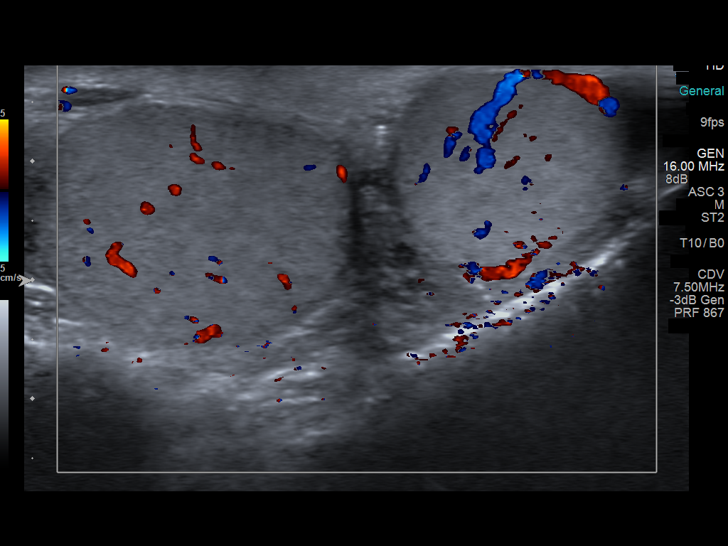
[im 23/49]
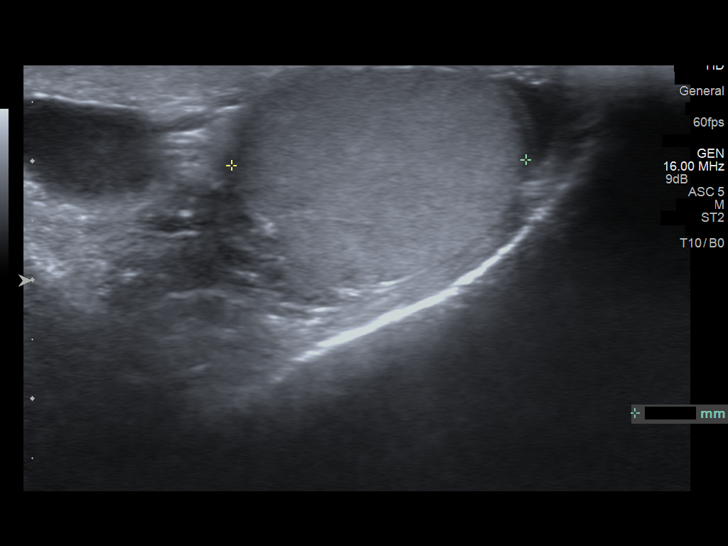
[im 27/49]
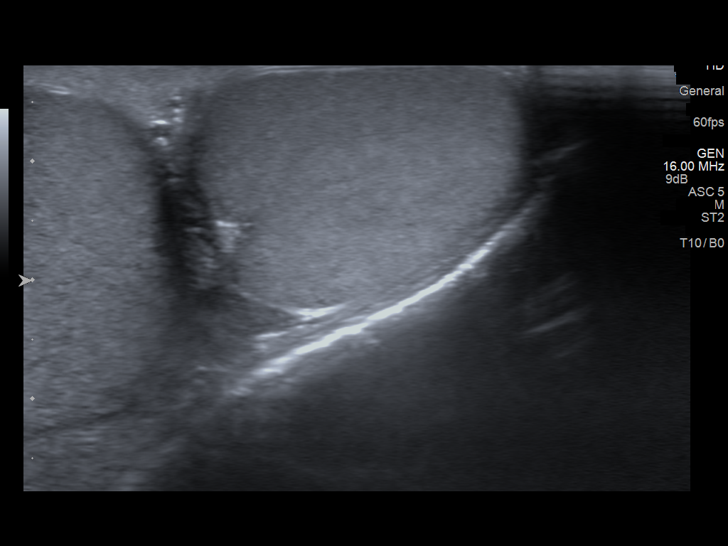
[im 31/49]
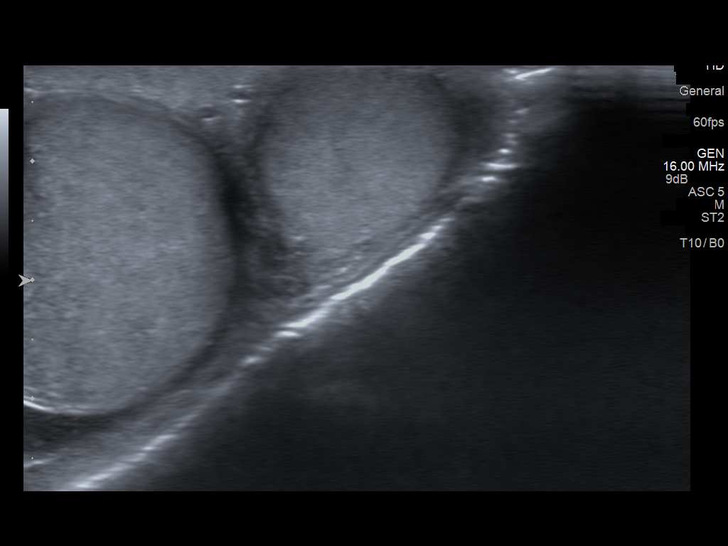
[im 33/49]
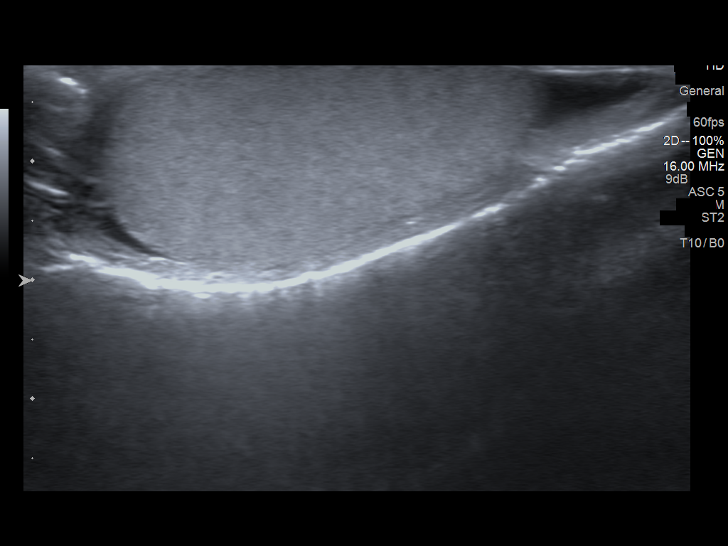
[im 37/49]
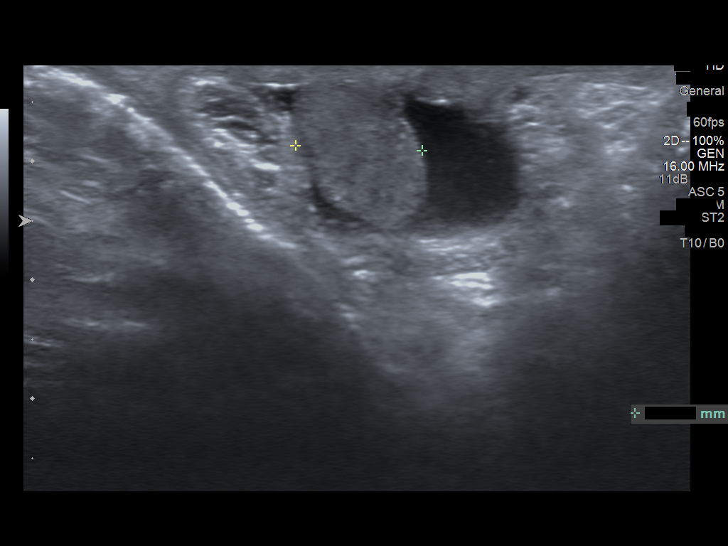
[im 41/49]
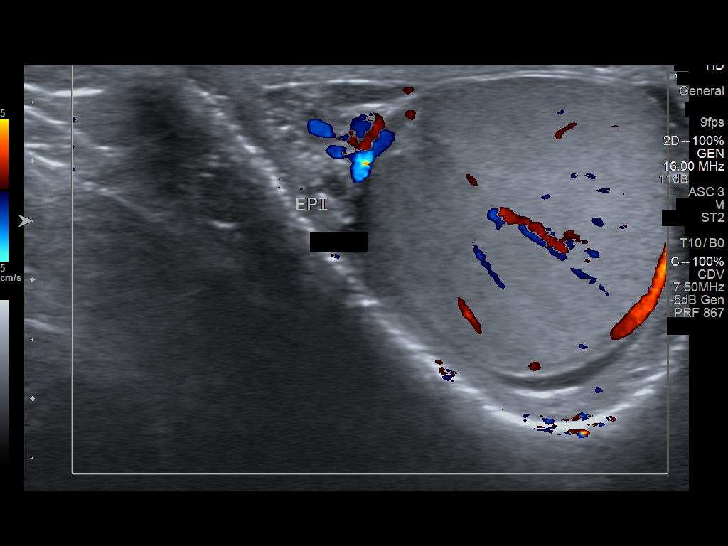
[im 45/49]
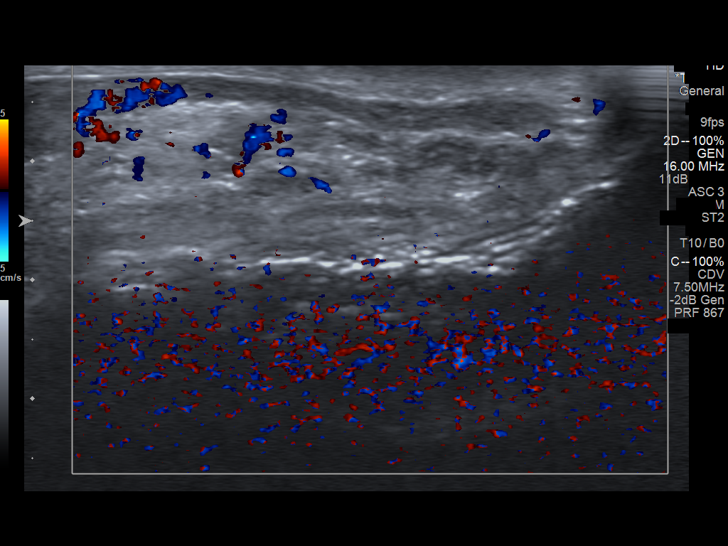
[im 49/49]
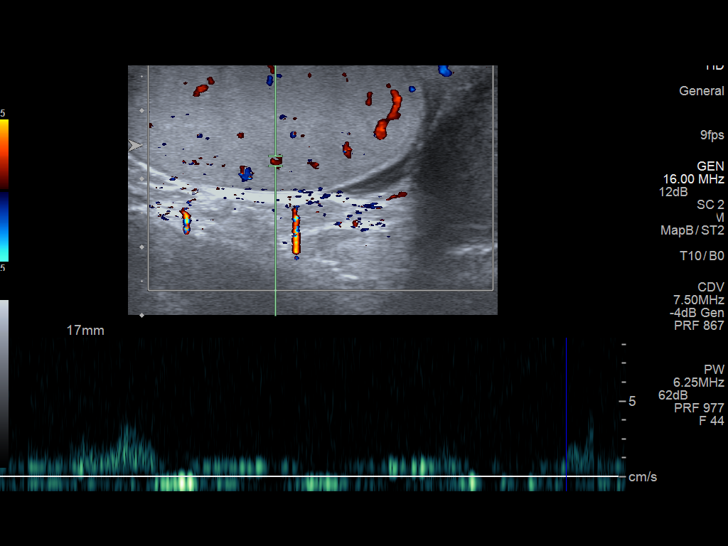

[14 of 25 positions shown; findings below may reference images not displayed]

FINDINGS: Right testicle

Measurements: 4.2 x by 1.8 x 3.0 cm.. No mass or microlithiasis
visualized.

Left testicle

Measurements: 4.4 x 2.0 x 2.5 cm.. No mass or microlithiasis
visualized. Slightly increased intratesticular blood flow.

Right epididymis:  Normal in size and appearance.

Left epididymis: There is prominence of the body and tail of the
left epididymis, with increased blood flow.

Hydrocele:  There is a small bilateral hydrocele.

Varicocele:  None visualized.

Pulsed Doppler interrogation of both testes demonstrates normal low
resistance arterial and venous waveforms bilaterally.
IMPRESSION: Evidence of left epididymitis and possible mild orchitis.

Normal appearance of the right testicle.

Small bilateral hydrocele.

## 2017-08-13 IMAGING — DX DG HAND COMPLETE 3+V*R*
3 series · 3 of 3 positions shown · non-contrast
Comparison: None.

CLINICAL DATA: The hand pain since an injury playing golf a couple
of weeks ago.

EXAM:
RIGHT HAND - COMPLETE 3+ VIEW

[hand pa]
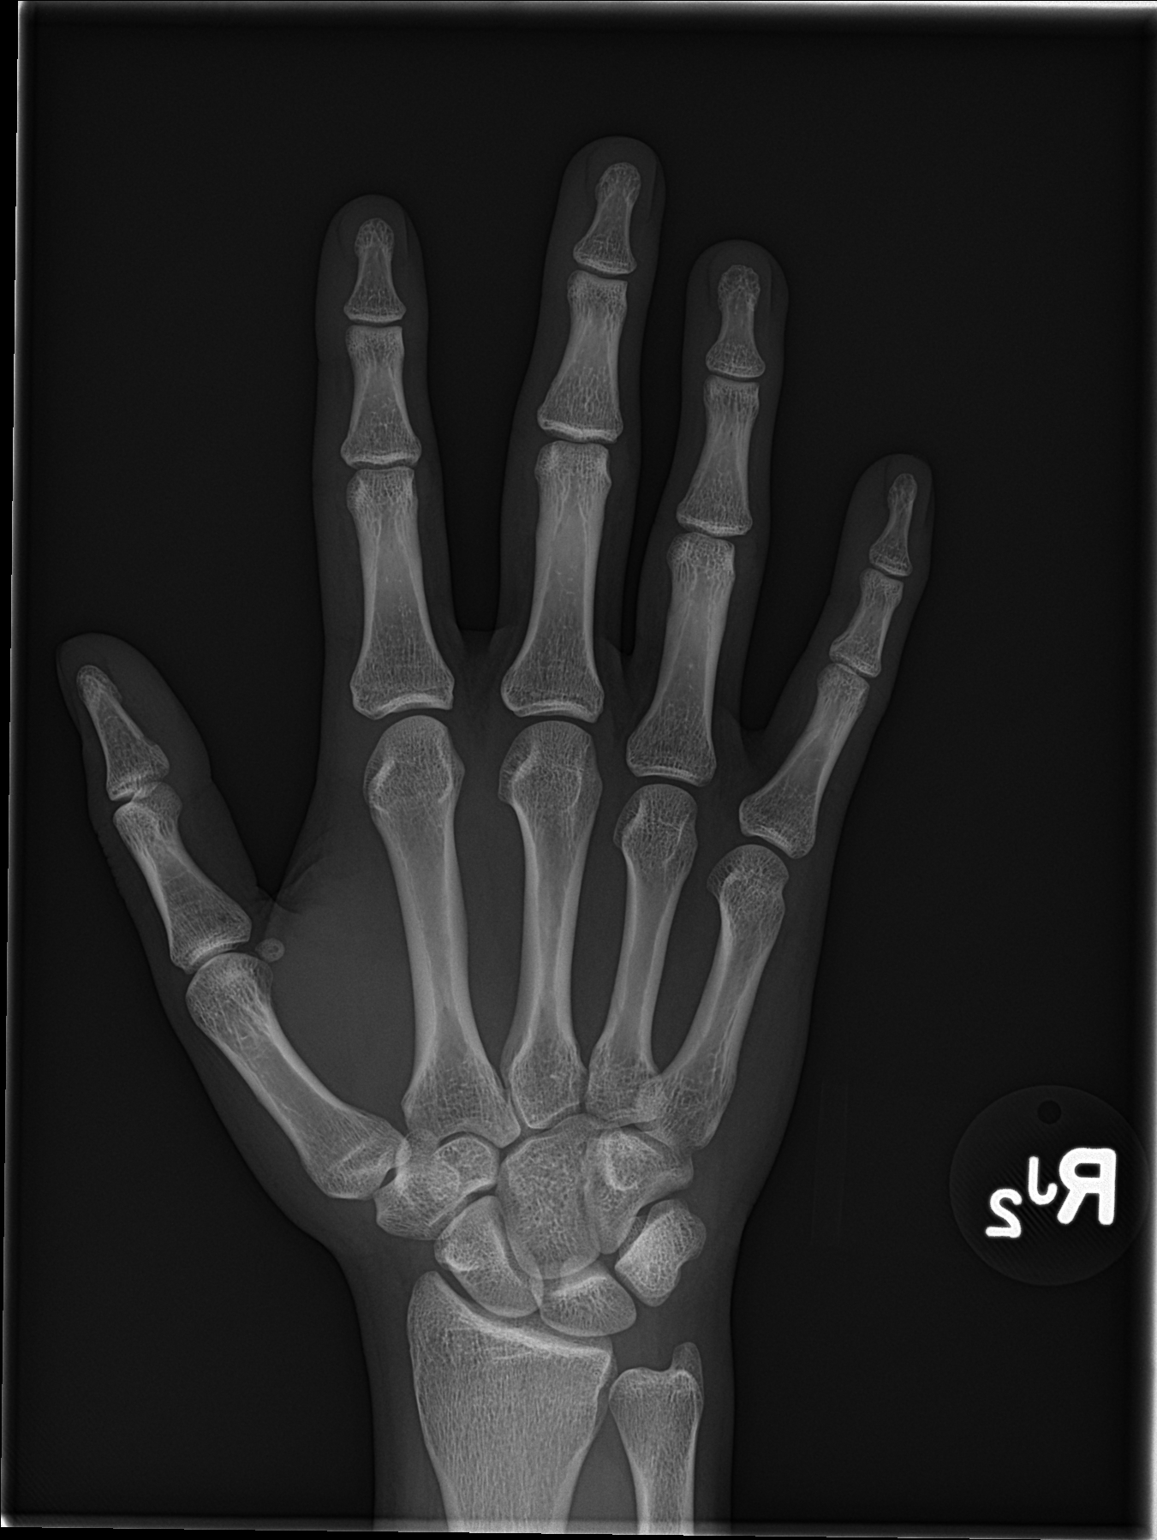

[hand obl]
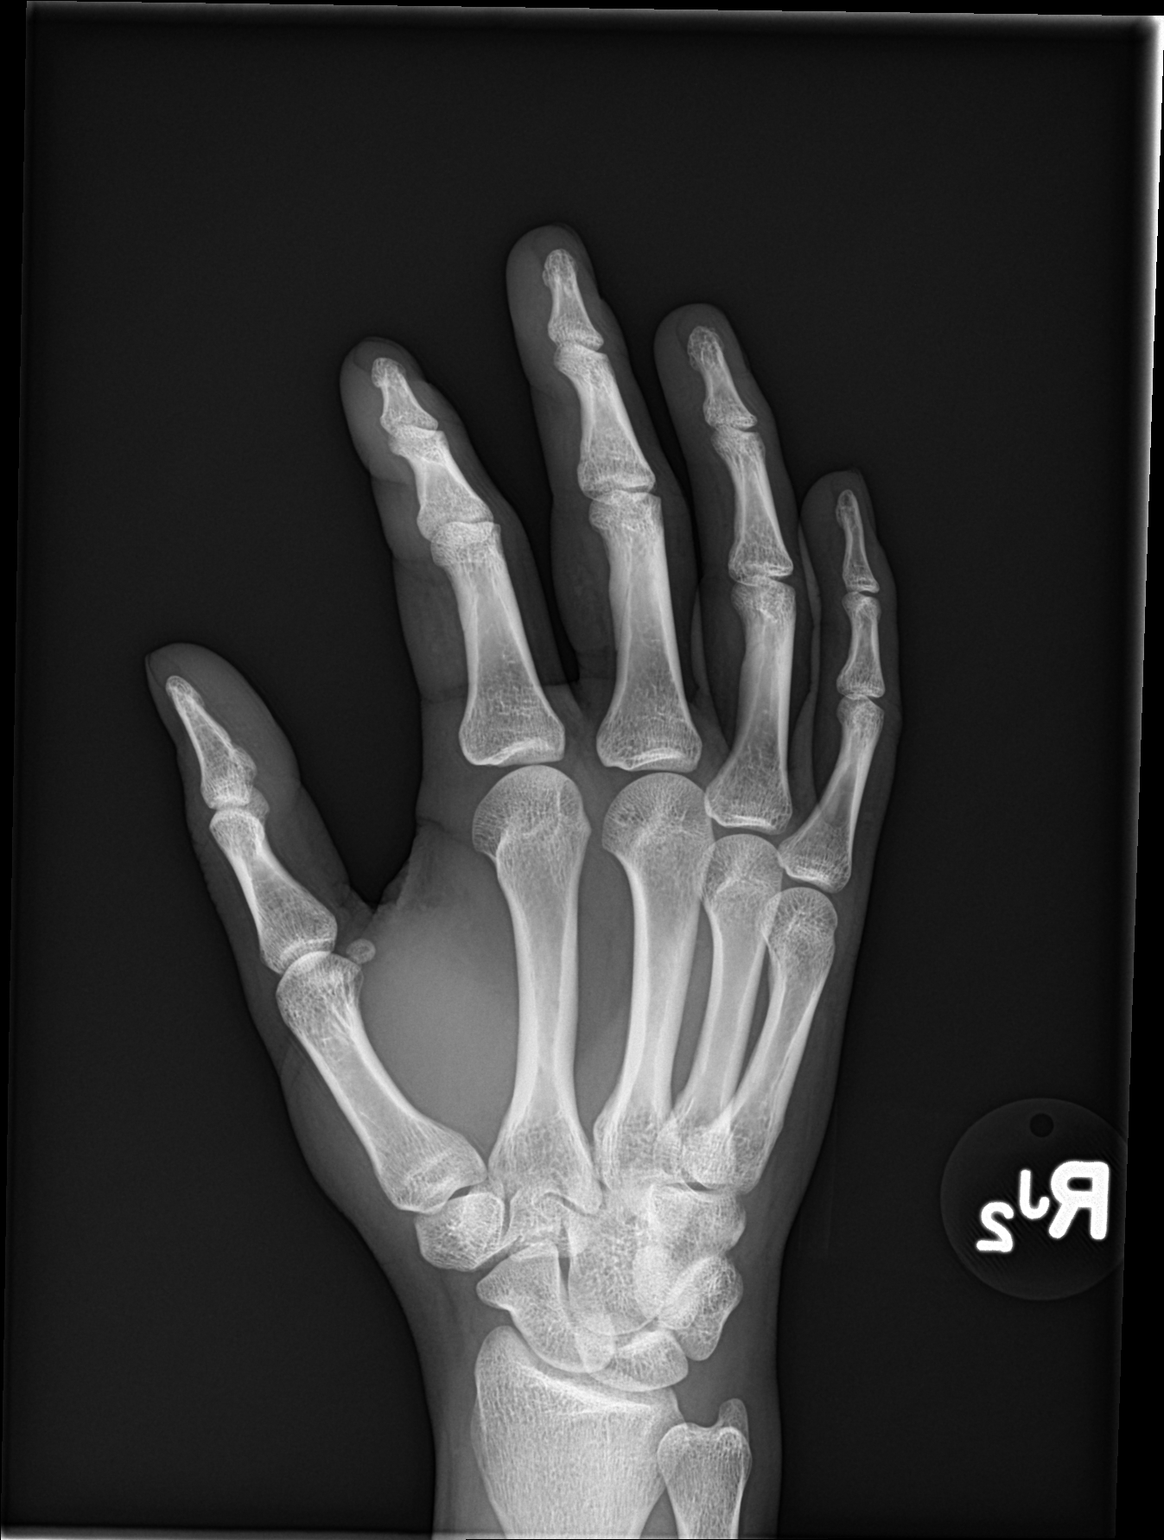

[hand lat]
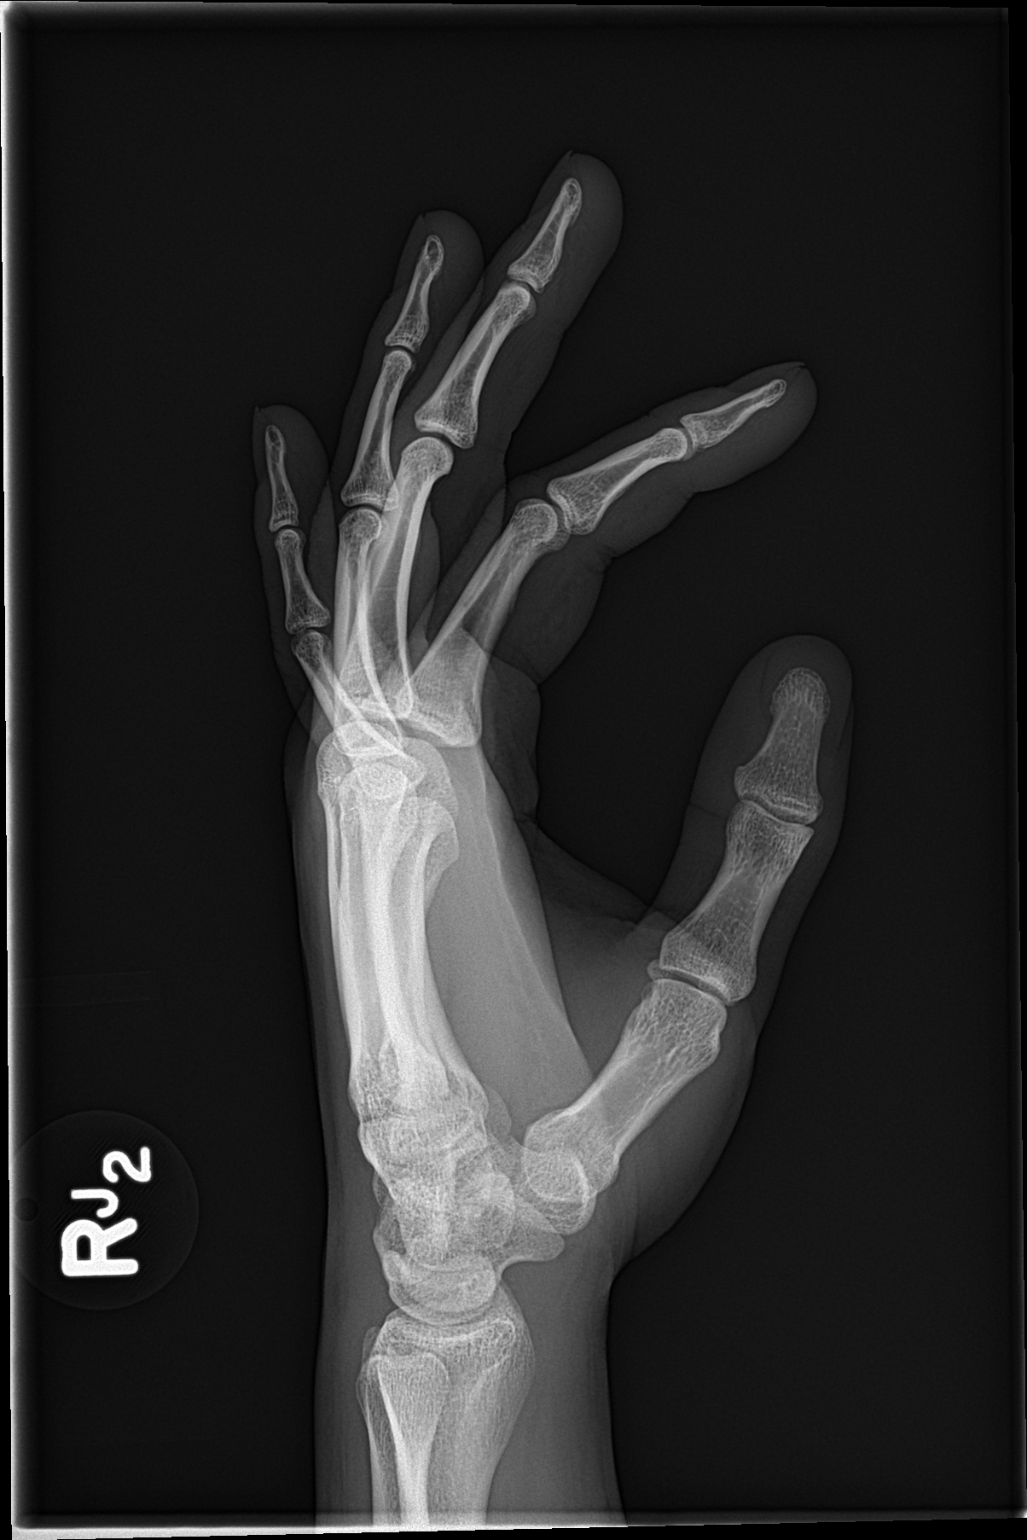

[3 of 3 positions shown; findings below may reference images not displayed]

FINDINGS: There is no evidence of fracture or dislocation. There is no
evidence of arthropathy or other focal bone abnormality. Soft
tissues are unremarkable.
IMPRESSION: Normal exam.

## 2021-02-11 ENCOUNTER — Other Ambulatory Visit: Payer: Self-pay

## 2021-02-11 ENCOUNTER — Ambulatory Visit (INDEPENDENT_AMBULATORY_CARE_PROVIDER_SITE_OTHER): Payer: BC Managed Care – PPO

## 2021-02-11 ENCOUNTER — Ambulatory Visit (INDEPENDENT_AMBULATORY_CARE_PROVIDER_SITE_OTHER): Payer: BC Managed Care – PPO | Admitting: Podiatry

## 2021-02-11 ENCOUNTER — Encounter: Payer: Self-pay | Admitting: Podiatry

## 2021-02-11 DIAGNOSIS — M2021 Hallux rigidus, right foot: Secondary | ICD-10-CM

## 2021-02-11 DIAGNOSIS — M7751 Other enthesopathy of right foot: Secondary | ICD-10-CM

## 2021-02-11 DIAGNOSIS — M205X1 Other deformities of toe(s) (acquired), right foot: Secondary | ICD-10-CM

## 2021-02-11 DIAGNOSIS — M79674 Pain in right toe(s): Secondary | ICD-10-CM | POA: Diagnosis not present

## 2021-02-12 LAB — CBC WITH DIFFERENTIAL/PLATELET
Absolute Monocytes: 761 cells/uL (ref 200–950)
Basophils Absolute: 94 cells/uL (ref 0–200)
Basophils Relative: 1 %
Eosinophils Absolute: 207 cells/uL (ref 15–500)
Eosinophils Relative: 2.2 %
HCT: 49.7 % (ref 38.5–50.0)
Hemoglobin: 17 g/dL (ref 13.2–17.1)
Lymphs Abs: 2829 cells/uL (ref 850–3900)
MCH: 31.7 pg (ref 27.0–33.0)
MCHC: 34.2 g/dL (ref 32.0–36.0)
MCV: 92.7 fL (ref 80.0–100.0)
MPV: 10.1 fL (ref 7.5–12.5)
Monocytes Relative: 8.1 %
Neutro Abs: 5508 cells/uL (ref 1500–7800)
Neutrophils Relative %: 58.6 %
Platelets: 303 10*3/uL (ref 140–400)
RBC: 5.36 10*6/uL (ref 4.20–5.80)
RDW: 11.9 % (ref 11.0–15.0)
Total Lymphocyte: 30.1 %
WBC: 9.4 10*3/uL (ref 3.8–10.8)

## 2021-02-12 LAB — RHEUMATOID FACTOR: Rheumatoid fact SerPl-aCnc: <14 IU/mL (ref <14)

## 2021-02-12 LAB — ANA: Anti Nuclear Antibody (ANA): NEGATIVE

## 2021-02-12 LAB — SEDIMENTATION RATE: Sed Rate: 11 mm/h (ref 0–15)

## 2021-02-12 LAB — URIC ACID: Uric Acid, Serum: 5.7 mg/dL (ref 4.0–8.0)

## 2021-02-12 LAB — C-REACTIVE PROTEIN: CRP: 0.7 mg/L (ref <8.0)

## 2021-02-13 ENCOUNTER — Other Ambulatory Visit: Payer: Self-pay | Admitting: Podiatry

## 2021-02-13 DIAGNOSIS — M2021 Hallux rigidus, right foot: Secondary | ICD-10-CM

## 2021-02-13 NOTE — Progress Notes (Signed)
Subjective:   Patient ID: Gregory Andrade, male   DOB: 31 y.o.   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  Patient does smoke 2 packs of cigarettes per day tries to be active   Review of Systems  All other systems reviewed and are negative.      Objective:  Physical Exam Vitals and nursing note reviewed.  Constitutional:      Appearance: He is well-developed.  Pulmonary:     Effort: Pulmonary effort is normal.  Musculoskeletal:        General: Normal range of motion.  Skin:    General: Skin is warm.  Neurological:     Mental Status: He is alert.    Neurovascular status found to be intact muscle strength found to be adequate range of motion adequate with patient noted to have no crepitus or first MPJ restriction of motion with the possibility for a functional condition.  Patient does have discomfort within the first MPJ with palpation and mostly with dorsiflexion but no swelling around the joint.  Patient is found to have good digital perfusion well oriented x3     Assessment:  Difficult problem to ascertain the possibility that he may have injured the joint with possibility for some type of cartilage issue or possibility of a functional type hallux limitus condition creating stress on the joint surface with patient who has had history of opioid abuse     Plan:  H&P x-rays reviewed discussed condition with him and mother.  I do need all of his previous notes of what was done and also his MRI results to try to make some type of determination as to this condition and do not recommend further injection as he has had 2 done up to now.  I also sent for blood work to  try to make sure there is no indications of our systemic arthritic or gouty condition which could contribute to the problem  X-rays do not seem to indicate a significant structural condition around the first MPJ with joint that appears to be healthy from a structural perspective

## 2021-03-27 ENCOUNTER — Other Ambulatory Visit: Payer: Self-pay

## 2021-03-27 ENCOUNTER — Ambulatory Visit (INDEPENDENT_AMBULATORY_CARE_PROVIDER_SITE_OTHER): Payer: BC Managed Care – PPO | Admitting: Podiatry

## 2021-03-27 ENCOUNTER — Encounter: Payer: Self-pay | Admitting: Podiatry

## 2021-03-27 ENCOUNTER — Ambulatory Visit (INDEPENDENT_AMBULATORY_CARE_PROVIDER_SITE_OTHER): Payer: BC Managed Care – PPO

## 2021-03-27 DIAGNOSIS — M205X1 Other deformities of toe(s) (acquired), right foot: Secondary | ICD-10-CM | POA: Diagnosis not present

## 2021-03-27 DIAGNOSIS — M778 Other enthesopathies, not elsewhere classified: Secondary | ICD-10-CM

## 2021-03-27 DIAGNOSIS — M7751 Other enthesopathy of right foot: Secondary | ICD-10-CM | POA: Diagnosis not present

## 2021-03-28 NOTE — Progress Notes (Signed)
Subjective:  ? ?Patient ID: Gregory Andrade, male   DOB: 31 y.o.   MRN: [id number]  ? ?HPI ?Patient presents stating the pain in his big toe joint has not gotten any better and is making it very difficult for him to play golf or do the types of activities that he likes to do.  States this has been at least 3 years that this has been going on and he does feel like he has weakness of the tendon that controls the big toe ? ? ?ROS ? ? ?   ?Objective:  ?Physical Exam  ?Neurovascular status was found to be intact with patient found to have mild reduction of motion of the first MPJ right and what appears to be some weakness of the EHL FHL tendon.  Patient does have pain when I pressed into the joint surface and does have mild swelling around the joint ? ?   ?Assessment:  ?As I explained to him and family very difficult condition with the possibility of a more functional hallux limitus creating an inflammatory process possibility for some form of damage to the cartilage that we cannot appreciate or other issues including neurological or chronic pain syndrome.  Patient did have previous opioid addiction and did do quite a bit of injecting in his hands and his right foot and does take gabapentin periodically to try to control pain he experiences especially in his hands ? ?   ?Plan:  ?H&P and x-rays of left feet taken today.  I reviewed at great length the difficulty of this condition and treatment options that could be considered.  I offered neurological consult but I do think this will be of limited value at this time and patient's family does not want this.  I did discuss consideration for osteotomy of the first metatarsal to lower and shorten it and try to open the joint flushed out and inspect the cartilage.  There is absolutely no long-term guarantees that this will make a difference for him and that we will be able to solve his problems and they understand this completely but after 3 years they would like to try  something to give him a chance in a more normal life.  Patient is good to hold off for a while continue with rigid bottom shoes has had orthotics in the past and we will consider surgery at 1 point later this year.  All questions answered today ? ?X-rays indicate there does not appear to be significant findings but there is mild elevation of the first metatarsal segment right over left and both metatarsals have slight elongation ? ? ?   ? ? ?

## 2021-04-19 ENCOUNTER — Other Ambulatory Visit: Payer: Self-pay | Admitting: Podiatry

## 2021-04-19 DIAGNOSIS — M778 Other enthesopathies, not elsewhere classified: Secondary | ICD-10-CM

## 2021-12-04 ENCOUNTER — Ambulatory Visit (INDEPENDENT_AMBULATORY_CARE_PROVIDER_SITE_OTHER): Payer: BC Managed Care – PPO | Admitting: Podiatry

## 2021-12-04 ENCOUNTER — Encounter: Payer: Self-pay | Admitting: Podiatry

## 2021-12-04 ENCOUNTER — Ambulatory Visit (INDEPENDENT_AMBULATORY_CARE_PROVIDER_SITE_OTHER): Payer: BC Managed Care – PPO

## 2021-12-04 DIAGNOSIS — M205X1 Other deformities of toe(s) (acquired), right foot: Secondary | ICD-10-CM | POA: Diagnosis not present

## 2021-12-04 NOTE — Progress Notes (Addendum)
Subjective:   Patient ID: Gregory Andrade, male   DOB: 31 y.o.   MRN: [id number]   HPI Patient presents with his mother stating that his big toe joint right remains very sore when he tries to bend it and that he has done everything he can do with previous injections anti-inflammatories shoe gear modifications and reduced activity not been successful.  Patient had not history of substance abuse and for surgical intervention that we have been discussing this year he only can utilize tramadol Tylenol ibuprofen   ROS      Objective:  Physical Exam  Neurovascular status found to be intact muscle strength is adequate except for some weakness of the flexor and extensor hallucis tendon right first MPJ which she states has been present for a long time.  He has had this problem for at least 4 years and when I pressed into the first MPJ it is very painful with some clinical enlargement around the dorsal and medial side of the first metatarsal head.  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  We discussed the long-term history and the fact he cannot do the types of activity he wants to do and despite all the rest conservative treatments that have been accomplished or tried he is not getting better and he continues to be a problem that he it is difficult for him to live with.  At this point I did discuss a plantar flexor he osteotomy of the first metatarsal right and inspection of the joint surface with flushing and possible osteochondral bone drilling.  I allowed them to read consent form going over at great length the surgery the difficulty of the surgery and  that there is absolutely no long-term guarantees that this will correct the problem.  They want this done and they do understand the recovery and I did discuss that total recovery will take approximately 6 months but I would like to have him back in regular shoes within 6 to 8 weeks.  All questions were answered and both he and his mother were satisfied and have full understanding of the surgery and what we are going to try to accomplish to hopefully help with this chronic condition.  He is given all preoperative instructions is scheduled for surgery encouraged to call if any other questions should come  X-rays indicate that there is moderate elevation of the first metatarsal segment right with what appears to be the possible beginnings of bone spur formation of the first MPJ and minimal change in the left. I also noted elevation of the intermetatarsal angle of approx 15 degrees on the right and deviation of the hallux in a lateral direction. All this was explained to him and also today air fracture walker dispensed for the postoperative.  I would like him to wear it prior to surgery to get used to it and it was fitted properly to his lower leg

## 2021-12-05 ENCOUNTER — Telehealth: Payer: Self-pay | Admitting: Podiatry

## 2021-12-05 NOTE — Telephone Encounter (Addendum)
DOS: 12/24/2021  BCBS Effective 01/20/2021  [place] Bunionectomy Bi-Planar Rt [phone])  DX: M20.11  Deductible: $9,100 with $8,937.34 remaining Out-of-Pocket: $9,100 with $2,876.81 remaining CoInsurance: 0%  Prior authorization is required through [employer].  [employer] Authorization #: [id number] Authorization Valid: 12/24/2021 - 02/21/2022

## 2021-12-23 MED ORDER — TRAMADOL HCL 50 MG PO TABS: 50.0000 mg | ORAL_TABLET | Freq: Three times a day (TID) | ORAL | 0 refills | Status: AC | PRN

## 2021-12-23 NOTE — Addendum Note (Signed)
Addended by: Lenn Sink on: 12/23/2021 02:51 PM   Modules accepted: Orders

## 2021-12-24 ENCOUNTER — Telehealth: Payer: Self-pay | Admitting: Podiatry

## 2021-12-24 ENCOUNTER — Other Ambulatory Visit: Payer: Self-pay

## 2021-12-24 ENCOUNTER — Other Ambulatory Visit: Payer: Self-pay | Admitting: Podiatry

## 2021-12-24 ENCOUNTER — Encounter: Payer: Self-pay | Admitting: Podiatry

## 2021-12-24 DIAGNOSIS — M2011 Hallux valgus (acquired), right foot: Secondary | ICD-10-CM | POA: Diagnosis not present

## 2021-12-24 MED ORDER — TRAMADOL HCL 50 MG PO TABS
50.0000 mg | ORAL_TABLET | Freq: Four times a day (QID) | ORAL | 0 refills | Status: AC | PRN
Start: 2021-12-24 — End: 2021-12-31

## 2021-12-24 NOTE — Telephone Encounter (Signed)
Medication was sent to the wrong pharmacy. Please resend to [facility] in [place] at [address]  Tramadol  Pt just got out of surgery and pts mom is waiting for this at pharmacy.

## 2021-12-24 NOTE — Telephone Encounter (Signed)
Patient mother is calling back to know that the status is. She states she needs her sons pain medication sent to [facility] on [address]  Mom wanted to know can we prescribe on day until Dr. Charlsie Merles gets back in the office.  Please advise

## 2021-12-24 NOTE — Progress Notes (Signed)
error 

## 2021-12-24 NOTE — Telephone Encounter (Signed)
Spoke to the patient mother and she needs his Tramadol sent to the [facility] on [address]. CMA's can't prescribed controlled substance. Can this be done asap due to the patient is in Surgery now and will not beable to pick his medication up in [place].

## 2021-12-24 NOTE — Telephone Encounter (Signed)
This message has already been sent multiple times to provider and on call provider. CMA's can't prescribe controlled substance.

## 2021-12-24 NOTE — Telephone Encounter (Signed)
I sent it to the pharmacy. Should be able to pickup within next hour

## 2021-12-24 NOTE — Telephone Encounter (Signed)
Pts mom is calling to speak with Morrie Sheldon regarding pts medication as per Dr. Charlsie Merles. Pt is currently in surgical center.   Please advise

## 2021-12-24 NOTE — Telephone Encounter (Signed)
Just spoke to Dr. Charlsie Merles and he will do it once he gets in front of his computer.

## 2021-12-24 NOTE — Telephone Encounter (Signed)
Pts Mom is calling back again for pain medication. Pt had sx this morning and the pain medication was sent to the wrong pharmacy. Mom spoke to Dr Charlsie Merles about this before they left and he wanted her to call to speak with Morrie Sheldon to get this moved to correct pharmacy. Morrie Sheldon is not able to prescribe narcotics and a provider would need to send this in for pt.  Mom is concerned and is currently waiting at pharmacy for medication.  Tramadol  Send to [facility] at [place] and [address]  Please advise

## 2021-12-26 NOTE — Telephone Encounter (Signed)
Please call and check. Early to get a refill

## 2021-12-27 ENCOUNTER — Telehealth: Payer: Self-pay | Admitting: *Deleted

## 2021-12-27 NOTE — Telephone Encounter (Signed)
Patient is calling to request something else for pain, having a hard time, please advise.

## 2021-12-27 NOTE — Telephone Encounter (Signed)
Have him take his ace wrap off and elevate high in air. Should be feeling a lot better today. He can take 4 ibuprophen and tylenol in between

## 2021-12-27 NOTE — Telephone Encounter (Signed)
Spoke with patient giving instructions per physician, verbalized understanding

## 2021-12-30 ENCOUNTER — Telehealth: Payer: Self-pay | Admitting: Podiatry

## 2021-12-30 ENCOUNTER — Ambulatory Visit (INDEPENDENT_AMBULATORY_CARE_PROVIDER_SITE_OTHER): Payer: BC Managed Care – PPO

## 2021-12-30 ENCOUNTER — Ambulatory Visit (INDEPENDENT_AMBULATORY_CARE_PROVIDER_SITE_OTHER): Payer: BC Managed Care – PPO | Admitting: Podiatry

## 2021-12-30 VITALS — BP 124/82 | HR 76

## 2021-12-30 DIAGNOSIS — M205X1 Other deformities of toe(s) (acquired), right foot: Secondary | ICD-10-CM

## 2021-12-30 MED ORDER — TRAMADOL HCL 50 MG PO TABS: 50.0000 mg | ORAL_TABLET | Freq: Four times a day (QID) | ORAL | 0 refills | Status: AC | PRN

## 2021-12-30 NOTE — Telephone Encounter (Signed)
Left voicemail on both numbers to have pt call us at his earliest convenience to see if he could come in today, 12/30/21 for his first postop visit on the nurse schedule either at 2:30 pm or at 4:00 pm per Dr. Beverlee Nims request.

## 2022-01-01 ENCOUNTER — Other Ambulatory Visit: Payer: BC Managed Care – PPO

## 2022-01-01 NOTE — Progress Notes (Signed)
Subjective:   Patient ID: Gregory Andrade, male   DOB: 31 y.o.   MRN: [id number]   HPI Patient presents stating the initial pain seems to be reduced I do get spasms in my foot   ROS      Objective:  Physical Exam  Neurovascular status intact negative Denna Haggard' sign noted wound edges found to be well-healed at the current time with the patient found to have good structure around the first MPJ minimal edema and difficult for me to bend his toe as he is very nervous     Assessment:  Patient is doing well post osteotomy biplanar right first metatarsal but does have high nervous factor that makes it difficult for me to talk with him or to try to encourage him for range of motion exercises     Plan:  H&P reviewed x-rays and talked with him and caregiver about trying to increase the range of motion of the first MPJ.  Patient was not able to come in easily over the next couple weeks so we will see him back after the first of the year but I did encourage him to try to bend the toe continue to wear the boot with surgical shoe dispensed and patient will be seen back for repeat visit or earlier if needed  X-rays indicate that there is an excellent alignment of the first MPJ the joint is in a better functional position and hopefully this will take care of his problem long-term

## 2022-01-03 ENCOUNTER — Telehealth: Payer: Self-pay | Admitting: *Deleted

## 2022-01-03 NOTE — Telephone Encounter (Signed)
He can bear as much as he wants with boot or shoe on. He should be full wt. Bearing at this time

## 2022-01-03 NOTE — Telephone Encounter (Signed)
Patient and mother are calling for more specifics on whether or not patient should be putting any weight on the post surgery foot with the boot or surgical shoe, if so how much should he be walking on a daily basis?. Please advise.

## 2022-01-03 NOTE — Telephone Encounter (Signed)
Spoke with patient giving him instructions, verbalized understanding, having still some swelling , encourage to use the ice pk to help reduce swelling.

## 2022-01-08 ENCOUNTER — Telehealth: Payer: Self-pay

## 2022-01-08 ENCOUNTER — Telehealth: Payer: Self-pay | Admitting: Podiatry

## 2022-01-08 ENCOUNTER — Encounter: Payer: Self-pay | Admitting: Podiatry

## 2022-01-08 NOTE — Telephone Encounter (Signed)
Pt is scheduled to see Dr Allena Katz 12.22 if needed.

## 2022-01-08 NOTE — Telephone Encounter (Signed)
Why? I can see him today

## 2022-01-08 NOTE — Telephone Encounter (Signed)
I've not seen the pictures

## 2022-01-08 NOTE — Telephone Encounter (Signed)
Pts mom called stating pt is still having pain,swelling and purple  area on surgical foot and is asking for someone to look at it on Friday. Pts mom sent pictures as well. I have forwarded pictures to provider and nurses.

## 2022-01-08 NOTE — Telephone Encounter (Signed)
Encounter created in error

## 2022-01-08 NOTE — Telephone Encounter (Signed)
yes

## 2022-01-10 ENCOUNTER — Ambulatory Visit: Payer: BC Managed Care – PPO | Admitting: Podiatry

## 2022-01-15 ENCOUNTER — Encounter: Payer: BC Managed Care – PPO | Admitting: Podiatry

## 2022-01-22 ENCOUNTER — Encounter: Payer: Self-pay | Admitting: Podiatry

## 2022-01-22 ENCOUNTER — Ambulatory Visit (INDEPENDENT_AMBULATORY_CARE_PROVIDER_SITE_OTHER): Payer: BC Managed Care – PPO | Admitting: Podiatry

## 2022-01-22 ENCOUNTER — Ambulatory Visit (INDEPENDENT_AMBULATORY_CARE_PROVIDER_SITE_OTHER): Payer: BC Managed Care – PPO

## 2022-01-22 DIAGNOSIS — M79671 Pain in right foot: Secondary | ICD-10-CM

## 2022-01-22 DIAGNOSIS — Z9889 Other specified postprocedural states: Secondary | ICD-10-CM | POA: Diagnosis not present

## 2022-01-22 NOTE — Progress Notes (Signed)
Subjective:   Patient ID: Gregory Andrade, male   DOB: 32 y.o.   MRN: [id number]   HPI Patient states he is doing much better but still does not have full motion of his big toe second toe on his own.  He states the pain has receded quite significantly at this point    ROS      Objective:  Physical Exam  Neurovascular status found to be intact negative [name]' sign noted no coldness to the foot wound edges well coapted and good range of motion of the first MPJ with patient stating that he is not having the pain that he had prior to surgery.  Is wearing a surgical shoe at this time    Assessment:  Appears to be progressing very well very happy with the motion incision site and no indications of other current pathology     Plan:  H&P reviewed x-ray with him and mom and discussed the importance of aggressive physical therapy and moving the big toe joint with body weight and himself.  At this point very pleased with how things are doing and encouraged him to continue along this path and I want him back in shoes in the next 2 weeks and instructed him to call if any issues were to occur.  Recheck again in the next 4 to 6 weeks  X-rays indicate osteotomies healing well fixation in place joint congruence

## 2022-03-03 ENCOUNTER — Ambulatory Visit (INDEPENDENT_AMBULATORY_CARE_PROVIDER_SITE_OTHER): Payer: BC Managed Care – PPO | Admitting: Podiatry

## 2022-03-03 ENCOUNTER — Encounter: Payer: Self-pay | Admitting: Podiatry

## 2022-03-03 ENCOUNTER — Ambulatory Visit (INDEPENDENT_AMBULATORY_CARE_PROVIDER_SITE_OTHER): Payer: BC Managed Care – PPO

## 2022-03-03 DIAGNOSIS — M205X1 Other deformities of toe(s) (acquired), right foot: Secondary | ICD-10-CM

## 2022-03-03 MED ORDER — NONFORMULARY OR COMPOUNDED ITEM: 3 refills | Status: AC

## 2022-03-03 NOTE — Progress Notes (Signed)
Subjective:   Patient ID: Gregory Andrade, male   DOB: 32 y.o.   MRN: [id number]   HPI Patient states he still has a lot of sensitivity around the big toe joint right and states that it is better as far as motion goes from before surgery but he still gets some discomfort   ROS      Objective:  Physical Exam  Neurovascular status intact negative [name]' sign noted excellent range of motion first MPJ no crepitus of the joint incision sites healing well mild inflammation and neurological like pain around the first MPJ     Assessment:  Overall doing well with 8 weeks post and excellent range of motion mild overall discomfort     Plan:  H&P done I am relatively confident about where we stand with him but I have get a put him on some topical medicine to try to see if this will help with remaining symptoms and discussed orthotics that we will make it next visit.  Reappoint for [place] to recheck  X-rays indicate the osteotomy is healing well alignment good position of the bone good joint congruence

## 2022-04-07 ENCOUNTER — Ambulatory Visit: Payer: BC Managed Care – PPO | Admitting: Podiatry

## 2022-04-16 ENCOUNTER — Ambulatory Visit: Payer: BC Managed Care – PPO | Admitting: Podiatry
